# Patient Record
Sex: Female | Born: 1965
Health system: Southern US, Community
[De-identification: ages and names within clinical notes are randomized; demographics above are authoritative.]

## PROBLEM LIST (undated history)

## (undated) DIAGNOSIS — R5383 Other fatigue: Secondary | ICD-10-CM

## (undated) DIAGNOSIS — B977 Papillomavirus as the cause of diseases classified elsewhere: Secondary | ICD-10-CM

## (undated) DIAGNOSIS — D649 Anemia, unspecified: Secondary | ICD-10-CM

## (undated) DIAGNOSIS — B009 Herpesviral infection, unspecified: Secondary | ICD-10-CM

## (undated) DIAGNOSIS — D219 Benign neoplasm of connective and other soft tissue, unspecified: Secondary | ICD-10-CM

## (undated) DIAGNOSIS — F32A Depression, unspecified: Secondary | ICD-10-CM

## (undated) DIAGNOSIS — F329 Major depressive disorder, single episode, unspecified: Secondary | ICD-10-CM

## (undated) HISTORY — DX: Anemia, unspecified: D64.9

## (undated) HISTORY — PX: CERVIX LESION DESTRUCTION: SHX591

## (undated) HISTORY — DX: Papillomavirus as the cause of diseases classified elsewhere: B97.7

## (undated) HISTORY — PX: TONSILLECTOMY AND ADENOIDECTOMY: SHX28

## (undated) HISTORY — DX: Major depressive disorder, single episode, unspecified: F32.9

## (undated) HISTORY — PX: CYSTECTOMY: SUR359

## (undated) HISTORY — DX: Benign neoplasm of connective and other soft tissue, unspecified: D21.9

## (undated) HISTORY — DX: Depression, unspecified: F32.A

## (undated) HISTORY — DX: Other fatigue: R53.83

## (undated) HISTORY — DX: Herpesviral infection, unspecified: B00.9

---

## 1986-03-23 HISTORY — PX: TMJ ARTHROPLASTY: SHX1066

## 1989-03-23 HISTORY — PX: COLONOSCOPY: SHX5424

## 1998-07-10 ENCOUNTER — Other Ambulatory Visit: Admission: RE | Admit: 1998-07-10 | Discharge: 1998-07-10 | Payer: Self-pay | Admitting: Gynecology

## 2010-01-17 ENCOUNTER — Encounter: Admission: RE | Admit: 2010-01-17 | Discharge: 2010-01-17 | Payer: Self-pay | Admitting: Family Medicine

## 2010-10-16 ENCOUNTER — Other Ambulatory Visit: Payer: Self-pay | Admitting: Emergency Medicine

## 2010-10-16 DIAGNOSIS — N6310 Unspecified lump in the right breast, unspecified quadrant: Secondary | ICD-10-CM

## 2010-10-17 ENCOUNTER — Encounter (INDEPENDENT_AMBULATORY_CARE_PROVIDER_SITE_OTHER): Payer: Self-pay | Admitting: Surgery

## 2010-10-23 ENCOUNTER — Inpatient Hospital Stay: Admission: RE | Admit: 2010-10-23 | Payer: Self-pay | Source: Ambulatory Visit

## 2010-10-23 ENCOUNTER — Other Ambulatory Visit: Payer: Self-pay

## 2010-10-28 ENCOUNTER — Encounter (INDEPENDENT_AMBULATORY_CARE_PROVIDER_SITE_OTHER): Payer: Self-pay | Admitting: Surgery

## 2010-10-28 ENCOUNTER — Ambulatory Visit (INDEPENDENT_AMBULATORY_CARE_PROVIDER_SITE_OTHER): Payer: BC Managed Care – PPO | Admitting: Surgery

## 2010-10-28 DIAGNOSIS — N6310 Unspecified lump in the right breast, unspecified quadrant: Secondary | ICD-10-CM

## 2010-10-28 DIAGNOSIS — N63 Unspecified lump in unspecified breast: Secondary | ICD-10-CM

## 2010-10-28 NOTE — Progress Notes (Addendum)
Rhonda Cruz is a 45 y.o. female.    Chief Complaint  Patient presents with  . Breast Mass    HPI HPI 45 yo female had a recent clinical breast exam by Dr. Cleta Alberts, who noted a 2 cm mass in the right upper outer quadrant.   The patient had a mammogram at [facility] in June of this year which was reportedly normal. We are awaiting that report. She has no complaints from her breast. No nipple discharge no retraction. No skin changes. No breast tenderness. She does have a family history of breast cancer in her mother at age 59. Her mother underwent a lumpectomy and what sounds like a sentinel lymph node biopsy. She does not think that her mother had chemotherapy. Past Medical History  Diagnosis Date  . Asthma   . Fatigue     Past Surgical History  Procedure Date  . Cystectomy     knee  . Tmj arthroplasty   . Tonsillectomy and adenoidectomy     Family History  Problem Relation Age of Onset  . Cancer Mother     breast  . Hypertension Father   . Cancer Father     skin  . Cancer Brother     signs of skin cancer     Social History History  Substance Use Topics  . Smoking status: Never Smoker   . Smokeless tobacco: Never Used  . Alcohol Use: 0.0 oz/week    3-1 Glasses of wine per week    No Known Allergies  Current Outpatient Prescriptions  Medication Sig Dispense Refill  . fish oil-omega-3 fatty acids 1000 MG capsule Take 2 g by mouth daily.        . Fluticasone-Salmeterol (ADVAIR DISKUS) 250-50 MCG/DOSE AEPB Inhale 1 puff into the lungs every 12 (twelve) hours.        . Vitamins-Lipotropics (VIT BALANCED B-100 PO) Take by mouth.          Review of Systems ROS Reviewed with the patient. Positive for chronic fatigue. Menarche age 80 only pregnancy at age 62 which resulted in a miscarriage.  Physical Exam Physical Exam   Blood pressure 118/76, pulse 60, temperature 97.6 F (36.4 C). WDWN in NAD.   Breasts - symmetric; no nipple retraction; no nipple discharge No  axillary lymphadenopathy Bilateral fibrocystic changes Right upper outer quadrant at 10:30 - 1.5 cm palpable mass - firm, mobile  Assessment/Plan Right upper outer quadrant breast mass Mammogram showed extremely dense breast tissue - decreasing the sensitivity of the mammogram.  Recommend right breast ultrasound - patient is scheduled for tomorrow at [facility].   Recheck in 1 week to discuss findings. Ceniya Fowers K. 10/28/2010, 3:59 PM   Addendum:  The mammogram and ultrasound this morning showed no suspicious findings in the area in question.  It is likely that this represents fibrocystic changes with a single area showing a slightly larger palpable mass.  We will recheck the patient in 3-6 months for another breast exam, but no biopsies are indicated at this time.

## 2010-10-29 ENCOUNTER — Encounter (INDEPENDENT_AMBULATORY_CARE_PROVIDER_SITE_OTHER): Payer: Self-pay | Admitting: Surgery

## 2010-10-29 ENCOUNTER — Ambulatory Visit
Admission: RE | Admit: 2010-10-29 | Discharge: 2010-10-29 | Disposition: A | Discharge status: Home / Self Care | Payer: BC Managed Care – PPO | Source: Ambulatory Visit | Attending: Emergency Medicine | Admitting: Emergency Medicine

## 2010-10-29 ENCOUNTER — Ambulatory Visit
Admission: RE | Admit: 2010-10-29 | Discharge: 2010-10-29 | Disposition: A | Discharge status: Home / Self Care | Payer: Self-pay | Source: Ambulatory Visit | Attending: Emergency Medicine | Admitting: Emergency Medicine

## 2010-10-29 DIAGNOSIS — N6310 Unspecified lump in the right breast, unspecified quadrant: Secondary | ICD-10-CM

## 2010-11-03 ENCOUNTER — Encounter (INDEPENDENT_AMBULATORY_CARE_PROVIDER_SITE_OTHER): Payer: BC Managed Care – PPO | Admitting: Surgery

## 2011-07-25 ENCOUNTER — Ambulatory Visit (INDEPENDENT_AMBULATORY_CARE_PROVIDER_SITE_OTHER): Payer: BC Managed Care – PPO | Admitting: Emergency Medicine

## 2011-07-25 VITALS — BP 113/71 | HR 68 | Temp 97.8°F | Resp 16 | Ht 62.0 in | Wt 131.6 lb

## 2011-07-25 DIAGNOSIS — N63 Unspecified lump in unspecified breast: Secondary | ICD-10-CM

## 2011-07-25 DIAGNOSIS — H109 Unspecified conjunctivitis: Secondary | ICD-10-CM

## 2011-07-25 DIAGNOSIS — N6310 Unspecified lump in the right breast, unspecified quadrant: Secondary | ICD-10-CM

## 2011-07-25 DIAGNOSIS — J45909 Unspecified asthma, uncomplicated: Secondary | ICD-10-CM

## 2011-07-25 DIAGNOSIS — J029 Acute pharyngitis, unspecified: Secondary | ICD-10-CM

## 2011-07-25 MED ORDER — OFLOXACIN 0.3 % OP SOLN: 2.0000 [drp] | OPHTHALMIC | Status: AC

## 2011-07-25 MED ORDER — FLUTICASONE-SALMETEROL 100-50 MCG/DOSE IN AEPB: 1.0000 | INHALATION_SPRAY | Freq: Two times a day (BID) | RESPIRATORY_TRACT | Status: DC

## 2011-07-25 MED ORDER — ALBUTEROL SULFATE HFA 108 (90 BASE) MCG/ACT IN AERS: 2.0000 | INHALATION_SPRAY | Freq: Four times a day (QID) | RESPIRATORY_TRACT | Status: DC | PRN

## 2011-07-25 NOTE — Progress Notes (Signed)
  Subjective:    Patient ID: Rhonda Cruz, female    DOB: Jan 30, 1966, 46 y.o.   MRN: [id number]  HPI for refill of her asthma medications. She also is been bothered with a drainage from both eyes. She's had a mild sore throat but not severe. She has been using Neosporin ointment to her eyes to    Review of Systems when she was here in August she was found to have a mass in her right upper quadrant of the right breast. She subsequently had a mammogram and ultrasound performed which did not disclose a mass. She saw Dr.Tsuei and from his note was due to have a followup appointment in 6 months. She states she did not know about this appointment. As noted the mass like area between her breasts primarily on the left Close to the sternum .    Objective:   Physical Exam HEENT exam exam reveals moderate redness of the conjunctiva of both eyes. There are no preauricular nodes the throat is slightly red. Chest is clear. There is a 1 x 1.5 cm smooth rubbery area over the sternum. Examination of the right breast reveals dense tissue present about 2 cm from the nipple on the right in the upper outer quadrant.    Results for orders placed in visit on 07/25/11  POCT RAPID STREP A (OFFICE)      Component Value Range   Rapid Strep A Screen Negative  Negative       Assessment & Plan:   I suspect most of her symptoms are allergy related. I will cover her with Ocuflox for both eyes. I have made referral to Dr. Corliss Skains. She had questions regarding travel to [place]. I've asked her to check with the health Department regarding immunizations and those things they are not able to do we will be happy to evaluate in the office period. I am not sure why she did not follow up with Dr. Corliss Skains as instructed. She states she does not know why.

## 2011-07-28 ENCOUNTER — Encounter (INDEPENDENT_AMBULATORY_CARE_PROVIDER_SITE_OTHER): Payer: Self-pay | Admitting: Surgery

## 2011-07-30 ENCOUNTER — Telehealth: Payer: Self-pay

## 2011-07-30 MED ORDER — FLUTICASONE-SALMETEROL 250-50 MCG/DOSE IN AEPB: 1.0000 | INHALATION_SPRAY | Freq: Two times a day (BID) | RESPIRATORY_TRACT | Status: DC

## 2011-07-30 NOTE — Telephone Encounter (Signed)
I sent an advair 250/50 to the pharmacy.  The eye drops are 2 drops every 4 hours while awake

## 2011-07-30 NOTE — Telephone Encounter (Signed)
Pt notified. Pt was very upset that we called in the wrong Advair. Pt was asking if she could exchange the Advair's and I told her I was not sure that she would have to speak with the pharmacy. Pt was also notified on the dosage of the eye drops. Pt states she is feeling better and wanted to know if she needed them since she hasn't used them yet. Per [name] if pt had not started using the drops and she is feeling better that she doesn't not have to use them. Pt then preceded to get very upset on the phone saying that she was going to contact her lawyer if she was unable to exchange the Advair because it was "our fault" and we should have to reimburse her for the Advair that was called in wrong and she's mad that she picked up these expensive eye drops only to not need them. Apologized a lot and told her per [name] that we do make mistakes no matter how hard we try not to.

## 2011-07-30 NOTE — Telephone Encounter (Signed)
Pt called, was seen over the weekend and received rx that Dr. Cleta Alberts prescribed.  Pt noticed dosage on Advair was incorrect, and there were no clear instructions for the eye drops.

## 2011-07-31 ENCOUNTER — Telehealth: Payer: Self-pay

## 2011-07-31 NOTE — Telephone Encounter (Signed)
Returned patients call. Upset with the problems she is having concerning wrong medication and repeated calls. Please help get worked out.  After discussion with Porfirio Oar, PA-C the plan was as follows: to contact the [employer] representative to seek assistance for this patient due to the lack of funds. [employer] donated a 1 month supply of medication and 2 coupons for further refills.  ZOX#[id number] Exp 08/2012

## 2011-07-31 NOTE — Telephone Encounter (Signed)
Good solution to difficult situation.  Thanks to all who helped get the patient what she needed.

## 2011-07-31 NOTE — Telephone Encounter (Signed)
Please refer to 07/30/2011 message

## 2011-07-31 NOTE — Telephone Encounter (Signed)
PT PICKED UP AND PAID FOR RX. GOT HOME AND REALIZED IT WAS THE WRONG ONE. PHARMACY CAN'T FIX IT WITHOUT [place]    AND ....   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

## 2011-08-14 ENCOUNTER — Encounter (INDEPENDENT_AMBULATORY_CARE_PROVIDER_SITE_OTHER): Payer: BC Managed Care – PPO | Admitting: Surgery

## 2011-08-15 NOTE — Progress Notes (Signed)
This encounter was created in error - please disregard.

## 2011-08-25 ENCOUNTER — Other Ambulatory Visit: Payer: Self-pay | Admitting: Emergency Medicine

## 2011-08-25 DIAGNOSIS — Z1231 Encounter for screening mammogram for malignant neoplasm of breast: Secondary | ICD-10-CM

## 2011-08-29 ENCOUNTER — Ambulatory Visit (INDEPENDENT_AMBULATORY_CARE_PROVIDER_SITE_OTHER): Payer: BC Managed Care – PPO | Admitting: Family Medicine

## 2011-08-29 VITALS — BP 123/81 | HR 64 | Temp 98.1°F | Resp 16 | Ht 61.5 in | Wt 132.0 lb

## 2011-08-29 DIAGNOSIS — Z23 Encounter for immunization: Secondary | ICD-10-CM

## 2011-08-29 DIAGNOSIS — H109 Unspecified conjunctivitis: Secondary | ICD-10-CM

## 2011-08-29 DIAGNOSIS — M25559 Pain in unspecified hip: Secondary | ICD-10-CM

## 2011-08-29 DIAGNOSIS — Z789 Other specified health status: Secondary | ICD-10-CM

## 2011-08-29 MED ORDER — HEPATITIS A VACCINE 1440 EL U/ML IM SUSP: 1.0000 mL | Freq: Once | INTRAMUSCULAR | Status: DC

## 2011-08-29 MED ORDER — TYPHOID VACCINE PO CPDR: 1.0000 | DELAYED_RELEASE_CAPSULE | ORAL | Status: AC

## 2011-08-29 MED ORDER — CIPROFLOXACIN HCL 500 MG PO TABS: ORAL_TABLET | ORAL | Status: DC

## 2011-08-29 MED ORDER — TETANUS-DIPHTH-ACELL PERTUSSIS 5-2.5-18.5 LF-MCG/0.5 IM SUSP: 0.5000 mL | Freq: Once | INTRAMUSCULAR | Status: DC

## 2011-08-29 MED ORDER — MEASLES, MUMPS & RUBELLA VAC ~~LOC~~ INJ: 0.5000 mL | INJECTION | Freq: Once | SUBCUTANEOUS | Status: DC

## 2011-08-29 MED ORDER — ATOVAQUONE-PROGUANIL HCL 250-100 MG PO TABS: 1.0000 | ORAL_TABLET | Freq: Every day | ORAL | Status: DC

## 2011-08-29 MED ORDER — ONDANSETRON 4 MG PO TBDP: ORAL_TABLET | ORAL | Status: DC

## 2011-08-29 NOTE — Progress Notes (Signed)
Subjective: Patient is here to review immunizations for travel to [place]. She will go there for 2 weeks. We had a long discussion regarding her immunizations and prophylaxis while there'  Patient has left hip pain. Is known injury. It seems to be tender deep in the hip. Hurts if she crosses her leg.  Has had some recurrences of that red eye doctor treated her for  Objective: At is only minimally red today. She used antibiotic drops yesterday. Fundi are normal.  Her hip is only minimally tender. Good range of motion. Gait normal.  Assessment: Travel medicine Hip pain and strain Recurrent conjunctivitis  Plan: Use the eyedrops for several days consecutively Aleve 2 twice daily Immunizations including MMR, Hep a, TDAP, malaria, cipro, zofran Typhoid oral

## 2011-08-29 NOTE — Patient Instructions (Addendum)
Hepatitis a requires a second vaccine in 6-12 months for full protection  Take the typhoid oral vaccine carefully according to instructions for effectiveness.  Start the Malarone a couple of days before departure. That will allow you to see if you tolerate it before you're traveling. Continue the Malarone until one week after you return.  In the event that you cannot tolerate it for some reason on your trip, go to a pharmacy and get doxycycline and take 100 mg once daily  Have medicine along for pain and fever, such as Tylenol or ibuprofen  Zofran 4 mg dissolved in mouth every 4-6 hours as needed for vomiting  Cipro 500 mg twice daily at onset of traveler's diarrhea to continue for 3-5 days

## 2011-09-14 ENCOUNTER — Encounter: Payer: Self-pay | Admitting: Obstetrics and Gynecology

## 2011-09-14 ENCOUNTER — Ambulatory Visit (INDEPENDENT_AMBULATORY_CARE_PROVIDER_SITE_OTHER): Payer: BC Managed Care – PPO | Admitting: Obstetrics and Gynecology

## 2011-09-14 VITALS — BP 112/68 | Temp 98.2°F | Ht 61.0 in | Wt 135.0 lb

## 2011-09-14 DIAGNOSIS — N912 Amenorrhea, unspecified: Secondary | ICD-10-CM

## 2011-09-14 DIAGNOSIS — N949 Unspecified condition associated with female genital organs and menstrual cycle: Secondary | ICD-10-CM

## 2011-09-14 DIAGNOSIS — Z01419 Encounter for gynecological examination (general) (routine) without abnormal findings: Secondary | ICD-10-CM

## 2011-09-14 DIAGNOSIS — Z124 Encounter for screening for malignant neoplasm of cervix: Secondary | ICD-10-CM

## 2011-09-14 DIAGNOSIS — R102 Pelvic and perineal pain: Secondary | ICD-10-CM

## 2011-09-14 LAB — POCT URINALYSIS DIPSTICK
Leukocytes, UA: NEGATIVE
Protein, UA: NEGATIVE
pH, UA: 5

## 2011-09-14 MED ORDER — TRANEXAMIC ACID 650 MG PO TABS: 1300.0000 mg | ORAL_TABLET | Freq: Three times a day (TID) | ORAL | Status: DC

## 2011-09-14 MED ORDER — TRANEXAMIC ACID 100 MG/ML IV SOLN: 1.5000 mg/kg/h | INTRAVENOUS | Status: DC

## 2011-09-14 NOTE — Progress Notes (Signed)
Subjective:    Rhonda Cruz is a 46 y.o. female, G1P0, who presents for an annual exam. The patient reports frequent urination for past several weeks.  Denies dysuria, fever, or hematuria. Going to [place] x 3 weeks  Hasn't had a period since April with transient hot flashes that have now abated.     Menstrual cycle:   LMP: Patient's last menstrual period was 07/15/2011.             Review of Systems Pertinent items are noted in HPI. Denies pelvic pain, urinary tract symptoms, vaginitis symptoms, irregular bleeding, menopausal symptoms, change in bowel habits or rectal bleeding   Objective:    BP 112/68  Temp 98.2 F (36.8 C) (Oral)  Ht 5\' 1"  (1.549 m)  Wt 135 lb (61.236 kg)  BMI 25.51 kg/m2  LMP 07/15/2011   @Weigh @t :  Wt Readings from Last 1 Encounters:  09/14/11 135 lb (61.236 kg)   Body mass index is 25.51 kg/(m^2). General Appearance: Alert, no acute distress HEENT: Grossly normal Neck / Thyroid: Supple, no thyromegaly or cervical adenopathy Lungs: Clear to auscultation bilaterally Back: No CVA tenderness Breast Exam: No masses or nodes.No dimpling, nipple retraction or discharge. Cardiovascular: Regular rate and rhythm.  Gastrointestinal: Soft, non-tender, no masses or organomegaly Pelvic Exam: EGBUS-wnl, vagina-normal rugae, cervix- without lesions or tenderness, uterus appears normal size shape and consistency, adnexae-no masses or tenderness Rectovaginal: no masses and normal sphincter tone Lymphatic Exam: Non-palpable nodes in neck, clavicular,  axillary, or inguinal regions  Skin: no rashes or abnormalities Extremities: no clubbing cyanosis or edema  Neurologic: grossly normal Psychiatric: Alert and oriented  Urinalysis:negative UPT: negative  Assessment:   Routine GYN Exam Amenorrhea * Left sided pelvic pain Plan:  Requesting medication in case she begins to bleed heavily while on her trip (since she's skipped her menses)   PAP  sent  Lysteda 650 mg # 30 2 po tid x 5 days prn (if menses RTO 1 year or prn  * Prior to discharge patient states it was uncomforable when she was examined on the left and that it is uncomfortable there with intercourse.   Pelvic U/S scheduled  Tyhesha Dutson,ELMIRAPA-C

## 2011-09-14 NOTE — Patient Instructions (Signed)
Probiotics:  [place] or [employer] for a  $30 coupon

## 2011-09-14 NOTE — Progress Notes (Signed)
Regular Periods: no Mammogram: yes 2012  Monthly Breast Ex.: no Exercise: yes  Tetanus < 10 years: yes Seatbelts: yes  NI. Bladder Functn.: yes Abuse at home: no  Daily BM's: yes Stressful Work: yes  Healthy Diet: yes Sigmoid-Colonoscopy: within 8 years  Calcium: no Medical problems this year: none   LAST PAP:04/03/2010  Contraception: none  Mammogram:  2012  PCP: none

## 2011-09-16 ENCOUNTER — Telehealth: Payer: Self-pay | Admitting: Obstetrics and Gynecology

## 2011-09-16 LAB — PAP IG W/ RFLX HPV ASCU

## 2011-09-16 NOTE — Telephone Encounter (Signed)
Pt called to request earlier appointment for pelvic ultrasound. Pt states she is going out of the country for 3 weeks and would like to resolve any issues that may be found on the ultrasound before she leaves. Advised pt that she will have appointment with EP on the same day as the ultrasound and that plan of care will be discussed at that time. Unable to offer patient any earlier appointments because there is nothing available for both ultrasound and EP before her scheduled appointment. Pt voiced understanding.

## 2011-09-16 NOTE — Telephone Encounter (Signed)
Triage/epic 

## 2011-09-17 ENCOUNTER — Ambulatory Visit
Admission: RE | Admit: 2011-09-17 | Discharge: 2011-09-17 | Disposition: A | Discharge status: Home / Self Care | Payer: BC Managed Care – PPO | Source: Ambulatory Visit | Attending: Emergency Medicine | Admitting: Emergency Medicine

## 2011-09-17 DIAGNOSIS — Z1231 Encounter for screening mammogram for malignant neoplasm of breast: Secondary | ICD-10-CM

## 2011-09-22 ENCOUNTER — Telehealth: Payer: Self-pay

## 2011-09-22 NOTE — Telephone Encounter (Signed)
Pt was in office on 08/29/11 and had immunizations for trip out of country.  States she needs documentation for trip. Best # to call [phone].

## 2011-09-22 NOTE — Telephone Encounter (Signed)
LMOM to pick up immunization print out.in the pick up box at check in.

## 2011-10-02 ENCOUNTER — Other Ambulatory Visit: Payer: BC Managed Care – PPO

## 2011-10-02 ENCOUNTER — Encounter: Payer: BC Managed Care – PPO | Admitting: Obstetrics and Gynecology

## 2011-10-03 ENCOUNTER — Ambulatory Visit (INDEPENDENT_AMBULATORY_CARE_PROVIDER_SITE_OTHER): Payer: BC Managed Care – PPO | Admitting: Family Medicine

## 2011-10-03 VITALS — BP 100/66 | HR 64 | Temp 98.5°F | Resp 16 | Ht 62.25 in | Wt 132.6 lb

## 2011-10-03 DIAGNOSIS — Z7189 Other specified counseling: Secondary | ICD-10-CM

## 2011-10-03 DIAGNOSIS — IMO0002 Reserved for concepts with insufficient information to code with codable children: Secondary | ICD-10-CM

## 2011-10-03 DIAGNOSIS — L259 Unspecified contact dermatitis, unspecified cause: Secondary | ICD-10-CM

## 2011-10-03 MED ORDER — TRIAMCINOLONE ACETONIDE 0.1 % EX CREA: TOPICAL_CREAM | Freq: Two times a day (BID) | CUTANEOUS | Status: AC

## 2011-10-03 NOTE — Progress Notes (Signed)
subjective: 46 year old lady with a rash for the last few days her right forearm around the elbow area and some on the right lower abdomen. She does not know of getting into any poison ivy but a CAT was in it. She says she's not been allergic to in the past. The rash is primarily itchy, not painful.  She wanted to discuss the record of her vaccinations. She's finished her typhoid. She is going to a non yellow fever area in [place]. Flies from [place] to [place]. It town. She had one trip [place] which is a malaria area. She will start her malaria medicine before then. She wanted to know if she needed a yellow card. I told her that use a cane with giving the yellow fever vaccine. Even though other shots are listed in it, I do not have any yellow fever cards.  Objective: Linear excoriations right elbow and right abdominal wall consistent with contact dermatitis  Assessment: Contact dermatitis Immunization review  Plan: Cognitive immunizations Triamcinolone cream [facility].

## 2011-10-05 ENCOUNTER — Ambulatory Visit (INDEPENDENT_AMBULATORY_CARE_PROVIDER_SITE_OTHER): Payer: BC Managed Care – PPO

## 2011-10-05 ENCOUNTER — Encounter: Payer: Self-pay | Admitting: Obstetrics and Gynecology

## 2011-10-05 ENCOUNTER — Ambulatory Visit (INDEPENDENT_AMBULATORY_CARE_PROVIDER_SITE_OTHER): Payer: BC Managed Care – PPO | Admitting: Obstetrics and Gynecology

## 2011-10-05 VITALS — BP 100/60 | HR 60 | Wt 134.0 lb

## 2011-10-05 DIAGNOSIS — N949 Unspecified condition associated with female genital organs and menstrual cycle: Secondary | ICD-10-CM

## 2011-10-05 DIAGNOSIS — D259 Leiomyoma of uterus, unspecified: Secondary | ICD-10-CM

## 2011-10-05 DIAGNOSIS — D219 Benign neoplasm of connective and other soft tissue, unspecified: Secondary | ICD-10-CM

## 2011-10-05 DIAGNOSIS — N9489 Other specified conditions associated with female genital organs and menstrual cycle: Secondary | ICD-10-CM

## 2011-10-05 DIAGNOSIS — R102 Pelvic and perineal pain: Secondary | ICD-10-CM

## 2011-10-05 NOTE — Progress Notes (Signed)
45 YO seen 09/14/11 for menorrhagia returns for ultrasound.  O: U/S- uterus-7.78 x 9.37 x 4.66 cm with #2 fibroids: posterior/left 7.3 x  5.9 x 6.8 cm and anterior fundal 6.1 x 6.9 x 6.3 cm and a submucosal vs polyp 1cm x 1cm; ovaries appear to be within normal limits   A: Menorrhagia     Uterine Fibroids with submucosal component vs polyp  P:  Reviewed medical & surgical management options for fibroids and menorrhagia       Patient to use Lysteda for now as she's going on a mission trip x 3 weeks       Will consider other management upon return       Patient may also consider a Diva Cup to prevent overflow of menses       RTO-Aex or prn

## 2011-10-05 NOTE — Patient Instructions (Signed)
Fibroids Fibroids are lumps (tumors) that can occur any place in a woman's body. These lumps are not cancerous. Fibroids vary in size, weight, and where they grow. HOME CARE  Do not take aspirin.   Write down the number of pads or tampons you use during your period. Tell your doctor. This can help determine the best treatment for you.  GET HELP RIGHT AWAY IF:  You have pain in your lower belly (abdomen) that is not helped with medicine.   You have cramps that are not helped with medicine.   You have more bleeding between or during your period.   You feel lightheaded or pass out (faint).   Your lower belly pain gets worse.  MAKE SURE YOU:  Understand these instructions.   Will watch your condition.   Will get help right away if you are not doing well or get worse.  Document Released: 04/11/2010 Document Revised: 02/26/2011 Document Reviewed: 04/11/2010 Exit[employer] Patient Information 2012 [employer]. 

## 2012-02-27 ENCOUNTER — Other Ambulatory Visit: Payer: Self-pay | Admitting: Emergency Medicine

## 2012-02-27 ENCOUNTER — Telehealth: Payer: Self-pay

## 2012-02-27 NOTE — Telephone Encounter (Signed)
Patient called asking why her eye drop medication was denied today. Patient says she called earlier but there was no documentation. She just wants to know if she needs to be seen again or not? Best number is cell phone.

## 2012-02-28 NOTE — Telephone Encounter (Signed)
Yes, she was seen in May for conjunctivitis. A refill is not appropriate. If she feels like she is developing symptoms again I would advise her to come in to be evaluated.

## 2012-02-28 NOTE — Telephone Encounter (Signed)
Called pt at [phone], [facility] to [facility]

## 2012-03-06 NOTE — Telephone Encounter (Signed)
Left message for pt to call back  °

## 2012-03-07 NOTE — Telephone Encounter (Signed)
Left message on machine to return back to clinic to be evaluated if she feels her symptoms are retuning.

## 2012-03-22 ENCOUNTER — Telehealth: Payer: Self-pay | Admitting: Obstetrics and Gynecology

## 2012-03-22 NOTE — Telephone Encounter (Signed)
Pt states she has not had a cycle since 01/01/12 and wants to if she needs to be seen.

## 2012-03-22 NOTE — Telephone Encounter (Signed)
Tc from pt stating that she has not had a cycle since 01/01/12 and wanted to know what she need to do. Pt states that she is going back to school and she do not want her cycle to come on and be really heavy so she want evaluation. Schedule pt an appt with EP on1/2/13. Pt voiced understanding.

## 2012-03-24 ENCOUNTER — Telehealth: Payer: Self-pay

## 2012-03-24 ENCOUNTER — Encounter: Payer: BC Managed Care – PPO | Admitting: Obstetrics and Gynecology

## 2012-03-24 NOTE — Telephone Encounter (Signed)
TELEPHONE CALL DONE

## 2012-07-05 ENCOUNTER — Telehealth: Payer: Self-pay

## 2012-07-05 NOTE — Telephone Encounter (Signed)
PATIENT STATES SHE WAS GOING TO [place] AND HAD A IMMUNIZATION LAST SUMMER.  FROM HER UNDERSTANDING, IT WAS SUPPOSE TO BE A 2 PART IMMUNIZATION. SHE HAS NEVER GOTTEN THE 2ND ONE. SHE WOULD LIKE TO KNOW WHAT SHE WAS GIVEN AND WHEN SHE IS TO RETURN? BEST PHONE [phone] (CELL)  [facility]

## 2012-07-06 NOTE — Telephone Encounter (Signed)
She had 1st Hep A, can come in for 2nd at any time.

## 2012-07-06 NOTE — Telephone Encounter (Signed)
Spoke with the patient and informed of what vaccination she received. Also, to come in for 2nd Hep A to finish the series. She understood and will return to the clinic for second Hep A.

## 2012-08-06 ENCOUNTER — Ambulatory Visit (INDEPENDENT_AMBULATORY_CARE_PROVIDER_SITE_OTHER): Payer: BC Managed Care – PPO | Admitting: Physician Assistant

## 2012-08-06 VITALS — BP 112/70 | HR 66 | Temp 97.8°F | Resp 16 | Ht 62.5 in | Wt 135.0 lb

## 2012-08-06 DIAGNOSIS — Z23 Encounter for immunization: Secondary | ICD-10-CM

## 2012-08-06 DIAGNOSIS — Z130 Encounter for screening for diseases of the blood and blood-forming organs and certain disorders involving the immune mechanism: Secondary | ICD-10-CM

## 2012-08-06 DIAGNOSIS — Z9229 Personal history of other drug therapy: Secondary | ICD-10-CM

## 2012-08-06 DIAGNOSIS — Z13228 Encounter for screening for other metabolic disorders: Secondary | ICD-10-CM

## 2012-08-06 NOTE — Progress Notes (Signed)
Patient ID: Rhonda Cruz MRN: [id number], DOB: 07-01-1965, 47 y.o. Date of Encounter: 08/06/2012, 9:59 AM  Primary Physician: Dois Davenport., MD  Chief Complaint: Immunization review  HPI: 47 y.o. female with history below presents for immunization review. 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 She will be living off campus. She has a copy of her immunizations with her. Needs PPD, 3rd hepatitis B, and 2nd hepatitis A vaccines. Generally healthy. She is otherwise doing well and has no other issues to discuss today.    Past Medical History  Diagnosis Date  . Asthma   . Fatigue   . Anemia   . Depression   . HSV infection   . HPV (human papilloma virus) infection      Home Meds: Prior to Admission medications   Medication Sig Start Date End Date Taking? Authorizing Provider  fish oil-omega-3 fatty acids 1000 MG capsule Take 2 g by mouth daily.     Yes Historical Provider, MD  Fluticasone-Salmeterol (ADVAIR DISKUS) 250-50 MCG/DOSE AEPB Inhale 1 puff into the lungs every 12 (twelve) hours. 07/30/11  Yes Marzella Schlein McClung, PA-C  ibuprofen (ADVIL,MOTRIN) 200 MG tablet Take 400 mg by mouth every 6 (six) hours as needed for pain.   Yes Historical Provider, MD  Maca Root 500 MG CAPS Take 1 capsule by mouth 2 (two) times daily.   Yes Historical Provider, MD  predniSONE (STERAPRED UNI-PAK) 10 MG tablet Take 10 mg by mouth daily. Pt taking for root canal- last dose is Sunday 08/07/12   Yes Historical Provider, MD  Vitamins-Lipotropics (VIT BALANCED B-100 PO) Take by mouth.    Yes Historical Provider, MD  albuterol (PROVENTIL HFA;VENTOLIN HFA) 108 (90 BASE) MCG/ACT inhaler Inhale 2 puffs into the lungs every 6 (six) hours as needed for wheezing. 07/25/11 07/24/12  Collene Gobble, MD  atovaquone-proguanil (MALARONE) 250-100 MG TABS Take 1 tablet by mouth daily. 08/29/11   Peyton Najjar, MD  ciprofloxacin (CIPRO) 500 MG tablet Take one twice daily beginning onset  of traveler's diarrhea, and continue for 3-5 days. 08/29/11   Peyton Najjar, MD  ondansetron (ZOFRAN ODT) 4 MG disintegrating tablet Dissolve one pill in mouth every 4-6 hours as needed for nausea and vomiting 08/29/11   Peyton Najjar, MD  tranexamic acid (LYSTEDA) 650 MG TABS Take 2 tablets (1,300 mg total) by mouth 3 (three) times daily. 09/14/11   Henreitta Leber, PA-C  triamcinolone cream (KENALOG) 0.1 % Apply topically 2 (two) times daily. 10/03/11 10/02/12  Peyton Najjar, MD    Allergies: No Known Allergies  History   Social History  . Marital Status: Single    Spouse Name: N/A    Number of Children: N/A  . Years of Education: N/A   Occupational History  . Not on file.   Social History Main Topics  . Smoking status: Never Smoker   . Smokeless tobacco: Never Used  . Alcohol Use: 0.0 oz/week    3-1 Glasses of wine per week     Comment: 1-3 drinks/ 2-3 days a week  . Drug Use: No  . Sexually Active: No   Other Topics Concern  . Not on file   Social History Narrative  . No narrative on file     Review of Systems: Constitutional: positive for night sweats. negative for chills, fever, weight changes, or fatigue  HEENT: negative for vision changes or hearing loss Cardiovascular: negative for chest pain or palpitations Respiratory:  negative for wheezing, shortness of breath, or cough Dermatological: negative for rash   Physical Exam: Blood pressure 112/70, pulse 66, temperature 97.8 F (36.6 C), temperature source Oral, resp. rate 16, height 5' 2.5" (1.588 m), weight 135 lb (61.236 kg), last menstrual period 03/23/2012, SpO2 100.00%., Body mass index is 24.28 kg/(m^2). General: Well developed, well nourished, in no acute distress. Head: Normocephalic, atraumatic, eyes without discharge, sclera non-icteric, nares are without discharge.   Neck: Supple. Full ROM.  Lungs: Clear bilaterally to auscultation without wheezes, rales, or rhonchi. Breathing is unlabored. Heart: RRR  with S1 S2. No murmurs, rubs, or gallops appreciated. Msk:  Strength and tone normal for age. Extremities/Skin: Warm and dry. No clubbing or cyanosis. No edema. No rashes or suspicious lesions. Neuro: Alert and oriented X 3. Moves all extremities spontaneously. Gait is normal. CNII-XII grossly in tact. Psych:  Responds to questions appropriately with a normal affect.     ASSESSMENT AND PLAN:  47 y.o. female here for immunization review for college -PPD placed, RTC 48-72 hours for reading -3rd hepatitis B vaccine received -2nd hepatitis A vaccine received -Form completed -RTC prn   Signed, Eula Listen, PA-C 08/06/2012 9:59 AM

## 2012-08-06 NOTE — Progress Notes (Signed)
  Subjective:    Patient ID: Rhonda Cruz, female    DOB: 07-03-1965, 47 y.o.   MRN: [id number]  HPI    Review of Systems     Tuberculosis Risk Questionnaire  1. Were you born outside the [place] in one of the following parts of the world: [place], [place], [place], [place] or [place]?  No  2. Have you traveled outside the [place] and lived for more than one month in one of the following parts of the world: [place], [place], [place], [place] or [place]?  No  3. Do you have a compromised immune system such as from any of the following conditions:HIV/AIDS, organ or bone marrow transplantation, diabetes, immunosuppressive medicines (e.g. Prednisone, Remicaide), leukemia, lymphoma, cancer of the head or neck, gastrectomy or jejunal bypass, end-stage renal disease (on dialysis), or silicosis?  No   4. Have you ever or do you plan on working in: a residential care center, a health care facility, a jail or prison or homeless shelter?  No  5. Have you ever: injected illegal drugs, used crack cocaine, lived in a homeless shelter  or been in jail or prison?   No  6. Have you ever been exposed to anyone with infectious tuberculosis?  No   Tuberculosis Symptom Questionnaire  Do you currently have any of the following symptoms?  1. Unexplained cough lasting more than 3 weeks? No  Unexplained fever lasting more than 3 weeks. No   3. Night Sweats (sweating that leaves the bedclothes and sheets wet)   Yes - most likely due to menopause  4. Shortness of Breath No  5. Chest Pain No  6. Unintentional weight loss  No  7. Unexplained fatigue (very tired for no reason) No   Objective:   Physical Exam        Assessment & Plan:

## 2012-08-09 ENCOUNTER — Ambulatory Visit (INDEPENDENT_AMBULATORY_CARE_PROVIDER_SITE_OTHER): Payer: BC Managed Care – PPO

## 2012-08-09 DIAGNOSIS — Z0389 Encounter for observation for other suspected diseases and conditions ruled out: Secondary | ICD-10-CM

## 2012-08-09 DIAGNOSIS — Z9289 Personal history of other medical treatment: Secondary | ICD-10-CM

## 2014-01-15 ENCOUNTER — Ambulatory Visit (INDEPENDENT_AMBULATORY_CARE_PROVIDER_SITE_OTHER): Payer: BC Managed Care – PPO | Admitting: Family Medicine

## 2014-01-15 VITALS — BP 114/74 | HR 64 | Temp 97.7°F | Resp 16 | Ht 62.0 in | Wt 132.0 lb

## 2014-01-15 DIAGNOSIS — J45990 Exercise induced bronchospasm: Secondary | ICD-10-CM

## 2014-01-15 DIAGNOSIS — Z23 Encounter for immunization: Secondary | ICD-10-CM

## 2014-01-15 MED ORDER — FLUTICASONE-SALMETEROL 250-50 MCG/DOSE IN AEPB: 1.0000 | INHALATION_SPRAY | Freq: Two times a day (BID) | RESPIRATORY_TRACT | Status: DC

## 2014-01-15 NOTE — Progress Notes (Signed)
[facility] [address] [place] [address] [phone]  Date:  01/15/2014   Name:  Rhonda Cruz   DOB:  1965/07/30   MRN:  [id number]  PCP:  Hayden Rasmussen., MD    Chief Complaint: Medication Refill   History of Present Illness:  Rhonda Cruz is a 48 y.o. very pleasant female patient who presents with the following:  History of asthma. Here today for a medication refill.  Last seen here over a year ago; she had moved to [facility] for a year to further her education but is now back in [place].  She has been on advair for several years. It seems to be working ok for her.  She usually uses her proair just before exercise but otherwise does not need it.  She is generally healthy and does not smoke, would like a flu shot today  Patient Active Problem List   Diagnosis Date Noted  . Mass of breast, right upper outer quadrant 10/28/2010    Past Medical History  Diagnosis Date  . Asthma   . Fatigue   . Anemia   . Depression   . HSV infection   . HPV (human papilloma virus) infection     Past Surgical History  Procedure Laterality Date  . Cystectomy      knee  . Tmj arthroplasty    . Tonsillectomy and adenoidectomy    . Tmj arthroplasty    . Cervix lesion destruction    . Colonoscopy  1991    History  Substance Use Topics  . Smoking status: Never Smoker   . Smokeless tobacco: Never Used  . Alcohol Use: 0.0 oz/week    3-1 Glasses of wine per week     Comment: 1-3 drinks/ 2-3 days a week    Family History  Problem Relation Age of Onset  . Cancer Mother     breast  . Hypertension Father   . Cancer Father     skin  . Cancer Brother     signs of skin cancer   . Hypertension Brother     No Known Allergies  Medication list has been reviewed and updated.  Current Outpatient Prescriptions on File Prior to Visit  Medication Sig Dispense Refill  . atovaquone-proguanil (MALARONE) 250-100 MG TABS Take 1 tablet by mouth daily.  32 tablet  0   . fish oil-omega-3 fatty acids 1000 MG capsule Take 2 g by mouth daily.        . Fluticasone-Salmeterol (ADVAIR DISKUS) 250-50 MCG/DOSE AEPB Inhale 1 puff into the lungs every 12 (twelve) hours.  60 each  3  . ibuprofen (ADVIL,MOTRIN) 200 MG tablet Take 400 mg by mouth every 6 (six) hours as needed for pain.      . Maca Root 500 MG CAPS Take 1 capsule by mouth 2 (two) times daily.      . Vitamins-Lipotropics (VIT BALANCED B-100 PO) Take by mouth.       Marland Kitchen albuterol (PROVENTIL HFA;VENTOLIN HFA) 108 (90 BASE) MCG/ACT inhaler Inhale 2 puffs into the lungs every 6 (six) hours as needed for wheezing.  1 Inhaler  5  . ondansetron (ZOFRAN ODT) 4 MG disintegrating tablet Dissolve one pill in mouth every 4-6 hours as needed for nausea and vomiting  10 tablet  0  . predniSONE (STERAPRED UNI-PAK) 10 MG tablet Take 10 mg by mouth daily. Pt taking for root canal- last dose is Sunday 08/07/12      . tranexamic  acid (LYSTEDA) 650 MG TABS Take 2 tablets (1,300 mg total) by mouth 3 (three) times daily.  30 tablet  0   Current Facility-Administered Medications on File Prior to Visit  Medication Dose Route Frequency Provider Last Rate Last Dose  . hepatitis A virus (PF) vaccine (HAVRIX (PF)) 1440 EL U/ML injection 1,440 Units  1 mL Intramuscular Once Posey Boyer, MD      . measles, mumps and rubella vaccine (MMR) injection 0.5 mL  0.5 mL Subcutaneous Once Posey Boyer, MD      . TDaP Durwin Reges) injection 0.5 mL  0.5 mL Intramuscular Once Posey Boyer, MD        Review of Systems:  As per HPI- otherwise negative.   Physical Examination: Filed Vitals:   01/15/14 0811  BP: 114/74  Pulse: 64  Temp: 97.7 F (36.5 C)  Resp: 16   Filed Vitals:   01/15/14 0811  Height: '5\' 2"'  (1.575 m)  Weight: 132 lb (59.875 kg)   Body mass index is 24.14 kg/(m^2). Ideal Body Weight: Weight in (lb) to have BMI = 25: 136.4  GEN: WDWN, NAD, Non-toxic, A & O x 3, looks well HEENT: Atraumatic, Normocephalic. Neck  supple. No masses, No LAD. Ears and Nose: No external deformity. CV: RRR, No M/G/R. No JVD. No thrill. No extra heart sounds. PULM: CTA B, no wheezes, crackles, rhonchi. No retractions. No resp. distress. No accessory muscle use. EXTR: No c/c/e NEURO Normal gait.  PSYCH: Normally interactive. Conversant. Not depressed or anxious appearing.  Calm demeanor.    Assessment and Plan: Exercise-induced asthma - Plan: Fluticasone-Salmeterol (ADVAIR DISKUS) 250-50 MCG/DOSE AEPB  Immunization due - Plan: Flu Vaccine QUAD 36+ mos IM  Asthma, mostly exercised induced.  Refill adviar, flu shot.   Ok to RF albuterol when she asks, does not need now  Signed Lamar Blinks, MD

## 2014-01-15 NOTE — Patient Instructions (Addendum)
Continue to use your adviar as directed and albuterol as needed.  Have your drug store send [place] a request when you need more albuterol. Take care!

## 2014-05-11 ENCOUNTER — Ambulatory Visit (INDEPENDENT_AMBULATORY_CARE_PROVIDER_SITE_OTHER): Payer: BLUE CROSS/BLUE SHIELD

## 2014-05-11 ENCOUNTER — Ambulatory Visit (INDEPENDENT_AMBULATORY_CARE_PROVIDER_SITE_OTHER): Payer: BLUE CROSS/BLUE SHIELD | Admitting: Emergency Medicine

## 2014-05-11 ENCOUNTER — Telehealth: Payer: Self-pay | Admitting: Family Medicine

## 2014-05-11 VITALS — BP 100/72 | HR 65 | Temp 97.5°F | Resp 18 | Ht 62.0 in | Wt 133.0 lb

## 2014-05-11 DIAGNOSIS — J45990 Exercise induced bronchospasm: Secondary | ICD-10-CM

## 2014-05-11 DIAGNOSIS — R0602 Shortness of breath: Secondary | ICD-10-CM

## 2014-05-11 DIAGNOSIS — M545 Low back pain: Secondary | ICD-10-CM

## 2014-05-11 DIAGNOSIS — R062 Wheezing: Secondary | ICD-10-CM

## 2014-05-11 DIAGNOSIS — J4530 Mild persistent asthma, uncomplicated: Secondary | ICD-10-CM

## 2014-05-11 DIAGNOSIS — M25561 Pain in right knee: Secondary | ICD-10-CM

## 2014-05-11 DIAGNOSIS — Z3200 Encounter for pregnancy test, result unknown: Secondary | ICD-10-CM

## 2014-05-11 LAB — PULMONARY FUNCTION TEST

## 2014-05-11 LAB — POCT URINE PREGNANCY
Preg Test, Ur: POSITIVE
Preg Test, Ur: NEGATIVE

## 2014-05-11 LAB — HCG, QUANTITATIVE, PREGNANCY: hCG, Beta Chain, Quant, S: <2 m[IU]/mL

## 2014-05-11 MED ORDER — FLUTICASONE-SALMETEROL 250-50 MCG/DOSE IN AEPB: 1.0000 | INHALATION_SPRAY | Freq: Two times a day (BID) | RESPIRATORY_TRACT | Status: DC

## 2014-05-11 MED ORDER — ALBUTEROL SULFATE HFA 108 (90 BASE) MCG/ACT IN AERS: 2.0000 | INHALATION_SPRAY | Freq: Four times a day (QID) | RESPIRATORY_TRACT | Status: DC | PRN

## 2014-05-11 NOTE — Progress Notes (Addendum)
This chart was scribed for Rhonda Jordan, MD by Rhonda Cruz, ED Scribe. This patient was seen in room 3 and the patient's care was started at 8:48 AM.  Subjective:    Patient ID: Rhonda Cruz, female    DOB: 01/14/1966, 49 y.o.   MRN: [id number]  Chief Complaint  Patient presents with  . Shortness of Breath    the past 2 mths illness   . Hip Pain    moving down toward knees ever since injury in [place]   . Knee Pain    wakes up at night with pain in rt knee towards top of foot   . rx refills    would like to see if there is something beside advair due to cost, rx alb inhaler    HPI CLOIE Cruz is a 49 y.o. female with PMhx of asthma presents to [facility] with multiple complaints.  Pt states that she is scheduled to run a marathon in June for which she is training for. However, she has been experiencing bilateral hip pain and right knee pain that radiates down to the top of her foot. She does endorse a hx of a back injury during yoga. Admits to having mild low back pain. Pt has been training daily, running approximately 1-2 miles.   Last week she states that she was sick with common cold symptoms or the past 2 months intermittently. However, during the course of her illness her breathing has not returned to normal. It is causing her to experience SOB and mild chest tightness, especially when she runs. Pt takes Advair 250mg  daily once a day. For which she is seeking a medication refill for, but would like a cheaper option. She is also seeking a refill on her albuterol inhaler which she states is expired. Pt reports 2 periods in the past year and is not concerned about pregnancy.   Pt denies fever, neck pain, sore throat, visual disturbance, CP, abdominal pain, nausea, emesis, diarrhea, urinary symptoms, HA, weakness, numbness and rash as associated symptoms.     Patient Active Problem List   Diagnosis Date Noted  . Mass of breast, right upper outer quadrant 10/28/2010   Past Medical  History  Diagnosis Date  . Asthma   . Fatigue   . Anemia   . Depression   . HSV infection   . HPV (human papilloma virus) infection    Past Surgical History  Procedure Laterality Date  . Cystectomy      knee  . Tmj arthroplasty    . Tonsillectomy and adenoidectomy    . Tmj arthroplasty    . Cervix lesion destruction    . Colonoscopy  1991   No Known Allergies Prior to Admission medications   Medication Sig Start Date End Date Taking? Authorizing Provider  albuterol (PROVENTIL HFA;VENTOLIN HFA) 108 (90 BASE) MCG/ACT inhaler Inhale 2 puffs into the lungs every 6 (six) hours as needed for wheezing. 07/25/11 05/11/14 Yes Rhonda Russian, MD  fish oil-omega-3 fatty acids 1000 MG capsule Take 2 g by mouth daily.     Yes Historical Provider, MD  Fluticasone-Salmeterol (ADVAIR DISKUS) 250-50 MCG/DOSE AEPB Inhale 1 puff into the lungs every 12 (twelve) hours. 01/15/14  Yes Rhonda Filler Copland, MD  ibuprofen (ADVIL,MOTRIN) 200 MG tablet Take 400 mg by mouth every 6 (six) hours as needed for pain.   Yes Historical Provider, MD  Vitamins-Lipotropics (VIT BALANCED B-100 PO) Take by mouth.    Yes Historical Provider, MD  Rhonda Cruz  Root 500 MG CAPS Take 1 capsule by mouth 2 (two) times daily.    Historical Provider, MD  ondansetron (ZOFRAN ODT) 4 MG disintegrating tablet Dissolve one pill in mouth every 4-6 hours as needed for nausea and vomiting Patient not taking: Reported on 05/11/2014 08/29/11   Rhonda Boyer, MD  tranexamic acid (LYSTEDA) 650 MG TABS Take 2 tablets (1,300 mg total) by mouth 3 (three) times daily. Patient not taking: Reported on 05/11/2014 09/14/11   Rhonda Regal, PA-C   History   Social History  . Marital Status: Single    Spouse Name: N/A  . Number of Children: N/A  . Years of Education: N/A   Occupational History  . Not on file.   Social History Main Topics  . Smoking status: Never Smoker   . Smokeless tobacco: Never Used  . Alcohol Use: 0.0 oz/week    3-1 Glasses of wine  per week     Comment: 1-3 drinks/ 2-3 days a week  . Drug Use: No  . Sexual Activity: No   Other Topics Concern  . Not on file   Social History Narrative    Review of Systems  Constitutional: Negative for fever and chills.  HENT: Positive for congestion.   Respiratory: Positive for shortness of breath. Negative for cough.   Gastrointestinal: Negative for nausea, vomiting and constipation.  Genitourinary: Negative for dysuria, urgency, frequency, flank pain and difficulty urinating.  Musculoskeletal: Positive for back pain and arthralgias. Negative for myalgias, joint swelling and gait problem.  Skin: Negative for rash.  Neurological: Negative for weakness, numbness and headaches.   Objective:   Physical Exam  Constitutional: She appears well-developed and well-nourished. No distress.  HENT:  Head: Normocephalic and atraumatic.  Eyes: Conjunctivae are normal. Right eye exhibits no discharge. Left eye exhibits no discharge.  Neck: Neck supple.  Cardiovascular: Normal rate, regular rhythm and normal heart sounds.  Exam reveals no gallop and no friction rub.   No murmur heard. Pulmonary/Chest: Effort normal and breath sounds normal. No respiratory distress.  Rare expiratory wheeze noted.   Abdominal: Soft. She exhibits no distension. There is no tenderness.  Musculoskeletal: She exhibits tenderness.  Minimal tenderness over the mid lumbar spine. No motor weakness.   Right knee: there is no redness of swelling. No instability noted. Negative McMurray.   Neurological: She is alert. She has normal strength. She displays a negative Romberg sign. Coordination and gait normal.  Reflex Scores:      Tricep reflexes are 2+ on the right side and 2+ on the left side.      Bicep reflexes are 2+ on the right side and 2+ on the left side.      Brachioradialis reflexes are 2+ on the right side and 2+ on the left side.      Patellar reflexes are 2+ on the right side and 2+ on the left side.       Achilles reflexes are 2+ on the right side and 2+ on the left side. Skin: Skin is warm and dry.  Psychiatric: She has a normal mood and affect. Her behavior is normal. Thought content normal.  Nursing note and vitals reviewed.  Filed Vitals:   05/11/14 0820  BP: 100/72  Pulse: 65  Temp: 97.5 F (36.4 C)  TempSrc: Oral  Resp: 18  Height: 5\' 2"  (1.575 m)  Weight: 133 lb (60.328 kg)  SpO2: 96%   [facility] reading (PRIMARY) by  Dr. Everlene Farrier please, no possible stress reaction medial  condyle of the knee. Chest x-ray no acute disease in her LS-spine normal.  Assessment & Plan:  Patient initially had a pregnancy test there was initially read as negative but on repeat eval was questioned positive. She had her x-rays done at that time. There is a questionable stress reaction medial condyle on her knee films. A serum hCG was done to verify whether she is pregnant or not. A repeat urine test was done and it was negative. She does have a history of uterine fibroids.I personally performed the services described in this documentation, which was scribed in my presence. The recorded information has been reviewed and is accurate. Serum hCG returned negative

## 2014-05-11 NOTE — Telephone Encounter (Signed)
Called patient earlier this afternoon to notify her of negative HCG. She voiced understanding. She wanted to know if there was anything else she needed to know or do. Per Dr, Peri Maris indicated arthritis but nothing else, slowly advance exercises, take OTC Aleve, Motrin, or Advil, do this and give a couple weeks. If no improvement or she is not where she wants or needs to be then recheck with Dr. Everlene Farrier and they can discuss sports med. Patient notified and voiced understanding.

## 2014-05-11 NOTE — Patient Instructions (Signed)

## 2014-05-16 ENCOUNTER — Telehealth: Payer: Self-pay

## 2014-05-16 NOTE — Telephone Encounter (Signed)
I changed her inhaler to Pro-Air, insurance would not cover Proventil. [place] per [place].

## 2014-05-22 ENCOUNTER — Encounter: Payer: Self-pay | Admitting: Emergency Medicine

## 2014-07-06 ENCOUNTER — Other Ambulatory Visit: Payer: Self-pay | Admitting: Obstetrics and Gynecology

## 2014-07-06 DIAGNOSIS — N6320 Unspecified lump in the left breast, unspecified quadrant: Secondary | ICD-10-CM

## 2014-07-13 ENCOUNTER — Other Ambulatory Visit: Payer: BC Managed Care – PPO

## 2014-07-16 ENCOUNTER — Ambulatory Visit
Admission: RE | Admit: 2014-07-16 | Discharge: 2014-07-16 | Disposition: A | Discharge status: Home / Self Care | Payer: BC Managed Care – PPO | Source: Ambulatory Visit | Attending: Obstetrics and Gynecology | Admitting: Obstetrics and Gynecology

## 2014-07-16 DIAGNOSIS — N6320 Unspecified lump in the left breast, unspecified quadrant: Secondary | ICD-10-CM

## 2015-02-27 DIAGNOSIS — S61451A Open bite of right hand, initial encounter: Secondary | ICD-10-CM | POA: Insufficient documentation

## 2015-06-29 ENCOUNTER — Telehealth: Payer: Self-pay | Admitting: Radiology

## 2015-06-29 ENCOUNTER — Ambulatory Visit (INDEPENDENT_AMBULATORY_CARE_PROVIDER_SITE_OTHER): Payer: 59 | Admitting: Internal Medicine

## 2015-06-29 VITALS — BP 128/76 | HR 68 | Temp 97.8°F | Resp 16 | Ht 62.0 in | Wt 133.1 lb

## 2015-06-29 DIAGNOSIS — J45990 Exercise induced bronchospasm: Secondary | ICD-10-CM

## 2015-06-29 DIAGNOSIS — G4700 Insomnia, unspecified: Secondary | ICD-10-CM

## 2015-06-29 DIAGNOSIS — F329 Major depressive disorder, single episode, unspecified: Secondary | ICD-10-CM | POA: Diagnosis not present

## 2015-06-29 MED ORDER — FLUOXETINE HCL 10 MG PO TABS: ORAL_TABLET | ORAL | Status: DC

## 2015-06-29 MED ORDER — TRIAZOLAM 0.25 MG PO TABS: ORAL_TABLET | ORAL | Status: DC

## 2015-06-29 MED ORDER — FLUTICASONE-SALMETEROL 250-50 MCG/DOSE IN AEPB: 1.0000 | INHALATION_SPRAY | Freq: Two times a day (BID) | RESPIRATORY_TRACT | Status: DC

## 2015-06-29 NOTE — Telephone Encounter (Signed)
The pharmacy found Dr Laney Pastor sig confusing on the Halcion  Have clarified one at hs prn, patient knows to use this if she awakes early

## 2015-06-29 NOTE — Progress Notes (Signed)
Subjective:  By signing my name below, I, Moises Blood, attest that this documentation has been prepared under the direction and in the presence of Tami Lin, MD. Electronically Signed: Moises Blood, [place]. 06/29/2015 , 9:22 AM .  Patient was seen in Room 8 .   Patient ID: Rhonda Cruz, female    DOB: [birth year], 50 y.o.   MRN: [id number] Chief Complaint  Patient presents with   Medication Refill    refill of advair    Depression    wants to discuss starting on medication    HPI Rhonda Cruz is a 50 y.o. female [facility]. She has recently entered therapy for depression. She also needs a medication refill of advair. She states having a history of asthma.   Depression Depression screen [facility] 2/9 06/29/2015  Decreased Interest 0  Down, Depressed, Hopeless 1  PHQ - 2 Score 1   She wants to start on medication for Depression and anxiety. She reports trying holistic methods with counseling without much relief. She was put on lamictal about 9-10 years ago with a lot of side effects so she's been resistant against taking medications since then. She also tried zoloft over 10 years ago. Depression has become more of an issue over the last year because of multiple life changes and untoward events  She's been feeling very overwhelmed:  2 years ago, she quit her job, moved to [place] for schooling for a year.  Her aunt passed away, neighbor is suing her for "dog barking at her", found a mass in her breast, relationship troubles, and going through menopause. Breakup of a longtime relationship. anhedonism now.  She's been having sleep issues where she is able to sleep but unable to stay asleep. She would wake up and ruminate for hours. She's recently started running again for some relief.   Patient Active Problem List   Diagnosis Date Noted   Mass of breast, right upper outer quadrant 10/28/2010    Current outpatient  prescriptions:    albuterol (PROVENTIL HFA;VENTOLIN HFA) 108 (90 BASE) MCG/ACT inhaler, Inhale 2 puffs into the lungs every 6 (six) hours as needed for wheezing., Disp: 1 Inhaler, Rfl: 5   b complex vitamins tablet, Take 1 tablet by mouth daily., Disp: , Rfl:    fish oil-omega-3 fatty acids 1000 MG capsule, Take 2 g by mouth daily.  , Disp: , Rfl:    Fluticasone-Salmeterol (ADVAIR DISKUS) 250-50 MCG/DOSE AEPB, Inhale 1 puff into the lungs every 12 (twelve) hours., Disp: 60 each, Rfl: 11   ibuprofen (ADVIL,MOTRIN) 200 MG tablet, Take 400 mg by mouth every 6 (six) hours as needed for pain. Reported on 06/29/2015, Disp: , Rfl:    Maca Root 500 MG CAPS, Take 1 capsule by mouth 2 (two) times daily. Reported on 06/29/2015, Disp: , Rfl:    ondansetron (ZOFRAN ODT) 4 MG disintegrating tablet, Dissolve one pill in mouth every 4-6 hours as needed for nausea and vomiting (Patient not taking: Reported on 05/11/2014), Disp: 10 tablet, Rfl: 0   tranexamic acid (LYSTEDA) 650 MG TABS, Take 2 tablets (1,300 mg total) by mouth 3 (three) times daily. (Patient not taking: Reported on 05/11/2014), Disp: 30 tablet, Rfl: 0   Vitamins-Lipotropics (VIT BALANCED B-100 PO), Take by mouth. Reported on 06/29/2015, Disp: , Rfl:   Current facility-administered medications:    hepatitis A virus (PF) vaccine (HAVRIX (PF)) 1440 EL U/ML injection 1,440 Units, 1 mL, Intramuscular, Once, Fenton Malling  Linna Darner, MD   Review of Systems  Constitutional: Negative for fever, chills and fatigue.  Gastrointestinal: Negative for nausea, vomiting and diarrhea.  Neurological: Negative for dizziness, weakness and numbness.  Psychiatric/Behavioral: Positive for sleep disturbance and dysphoric mood. Negative for suicidal ideas and self-injury. The patient is not nervous/anxious.        Objective:   Physical Exam  Constitutional: She is oriented to person, place, and time. She appears well-developed and well-nourished. No distress.  HENT:  Head:  Normocephalic and atraumatic.  Eyes: EOM are normal. Pupils are equal, round, and reactive to light.  Neck: Neck supple.  Cardiovascular: Normal rate.   Pulmonary/Chest: Effort normal. No respiratory distress.  Musculoskeletal: Normal range of motion.  Neurological: She is alert and oriented to person, place, and time.  Skin: Skin is warm and dry.  Psychiatric:  Emotional/engaged/in tears at times during discussion/very thoughtful and perceptive No grandiosity/no flights of ideas/no delusions Thought content normal  Nursing note and vitals reviewed.   BP 128/76 mmHg   Pulse 68   Temp(Src) 97.8 F (36.6 C) (Oral)   Resp 16   Ht 5\' 2"  (1.575 m)   Wt 133 lb 2 oz (60.385 kg)   BMI 24.34 kg/m2   SpO2 96%    Assessment & Plan:  Reactive depression  Insomnia  Exercise-induced asthma - Plan: Fluticasone-Salmeterol (ADVAIR DISKUS) 250-50 MCG/DOSE AEPB  Meds ordered this encounter  Medications   b complex vitamins tablet    Sig: Take 1 tablet by mouth daily.   FLUoxetine (PROZAC) 10 MG tablet    Sig: One tab a day for 5 d then 2 tabs a day    Dispense:  60 tablet    Refill:  0   triazolam (HALCION) 0.25 MG tablet    Sig: One when wakes early    Dispense:  30 tablet    Refill:  0   Fluticasone-Salmeterol (ADVAIR DISKUS) 250-50 MCG/DOSE AEPB    Sig: Inhale 1 puff into the lungs every 12 (twelve) hours.    Dispense:  60 each    Refill:  11   Continue counseling Follow-up through my chart with office visit in 4 weeks for medication adjustment I have completed the patient encounter in its entirety as documented by the scribe, with editing by me where necessary. Robert P. Laney Pastor, M.D.

## 2015-06-29 NOTE — Patient Instructions (Signed)
     IF you received an x-ray today, you will receive an invoice from [facility]. Please contact  Radiology at [phone] with questions or concerns regarding your invoice.   IF you received labwork today, you will receive an invoice from [facility]. Please contact [facility] at [phone] with questions or concerns regarding your invoice.   Our billing staff will not be able to assist you with questions regarding bills from these companies.  You will be contacted with the lab results as soon as they are available. The fastest way to get your results is to activate your My Chart account. Instructions are located on the last page of this paperwork. If you have not heard from us regarding the results in 2 weeks, please contact this office.      

## 2015-07-24 ENCOUNTER — Ambulatory Visit (INDEPENDENT_AMBULATORY_CARE_PROVIDER_SITE_OTHER): Payer: 59 | Admitting: Internal Medicine

## 2015-07-24 VITALS — BP 116/68 | HR 60 | Temp 98.6°F | Resp 17 | Ht 62.0 in | Wt 130.0 lb

## 2015-07-24 DIAGNOSIS — F329 Major depressive disorder, single episode, unspecified: Secondary | ICD-10-CM

## 2015-07-24 MED ORDER — FLUOXETINE HCL 10 MG PO TABS: 20.0000 mg | ORAL_TABLET | Freq: Every day | ORAL | Status: DC

## 2015-07-24 NOTE — Patient Instructions (Signed)
     IF you received an x-ray today, you will receive an invoice from [facility]. Please contact [facility] at [phone] with questions or concerns regarding your invoice.   IF you received labwork today, you will receive an invoice from [facility]. Please contact [facility] at [phone] with questions or concerns regarding your invoice.   Our billing staff will not be able to assist you with questions regarding bills from these companies.  You will be contacted with the lab results as soon as they are available. The fastest way to get your results is to activate your My Chart account. Instructions are located on the last page of this paperwork. If you have not heard from us regarding the results in 2 weeks, please contact this office.      

## 2015-07-24 NOTE — Progress Notes (Signed)
By signing my name below I, Tereasa Coop, attest that this documentation has been prepared under the direction and in the presence of Tami Lin, MD. Electonically Signed. Tereasa Coop, Scribe 07/24/2015 at 9:18 AM  Subjective:    Patient ID: Rhonda Cruz, female    DOB: 06-10-1965, 50 y.o.   MRN: [id number]  Chief Complaint  Patient presents with  . Medication Refill    PROZAC    HPI Rhonda Cruz is a 50 y.o. female who presents to the [facility] requesting medication refill.   Pt states she has been much better this month and has also been sleeping much better. Pt states she has been running and seeing a Social worker. Pt denies any negative side-effects from prozac.   Pt also reports a chronic history of TMJ and states the TMJ worsened when she started fluoxetine. Pt denies difficulty chewing. Pt states that she cannot fully open her jaw. Her dentist now involved  Pt also state she has had a increased frequency of HAs this month. Pt states that she has stopped drinking coffee this month.   Pt is taking 20mg  prozac currently.   Pt states she is going to a music festival next weekend and states she is very excited about it. 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   Patient Active Problem List   Diagnosis Date Noted  . Insomnia 06/29/2015  . Exercise-induced asthma 06/29/2015  . Open bite of right hand 02/27/2015  . Mass of breast, right upper outer quadrant 10/28/2010    Current outpatient prescriptions:  .  albuterol (PROVENTIL HFA;VENTOLIN HFA) 108 (90 BASE) MCG/ACT inhaler, Inhale 2 puffs into the lungs every 6 (six) hours as needed for wheezing., Disp: 1 Inhaler, Rfl: 5 .  b complex vitamins tablet, Take 1 tablet by mouth daily., Disp: , Rfl:  .  fish oil-omega-3 fatty acids 1000 MG capsule, Take 2 g by mouth daily.  , Disp: , Rfl:  .  FLUoxetine (PROZAC) 10 MG tablet, One tab a day for 5 d then 2  tabs a day, Disp: 60 tablet, Rfl: 0 .  Fluticasone-Salmeterol (ADVAIR DISKUS) 250-50 MCG/DOSE AEPB, Inhale 1 puff into the lungs every 12 (twelve) hours., Disp: 60 each, Rfl: 11 .  ibuprofen (ADVIL,MOTRIN) 200 MG tablet, Take 400 mg by mouth every 6 (six) hours as needed for pain. Reported on 07/24/2015, Disp: , Rfl:  .  Maca Root 500 MG CAPS, Take 1 capsule by mouth 2 (two) times daily. Reported on 07/24/2015, Disp: , Rfl:  .  ondansetron (ZOFRAN ODT) 4 MG disintegrating tablet, Dissolve one pill in mouth every 4-6 hours as needed for nausea and vomiting (Patient not taking: Reported on 05/11/2014), Disp: 10 tablet, Rfl: 0 .  tranexamic acid (LYSTEDA) 650 MG TABS, Take 2 tablets (1,300 mg total) by mouth 3 (three) times daily. (Patient not taking: Reported on 05/11/2014), Disp: 30 tablet, Rfl: 0 .  triazolam (HALCION) 0.25 MG tablet, One when wakes early (Patient not taking: Reported on 07/24/2015), Disp: 30 tablet, Rfl: 0 .  Vitamins-Lipotropics (VIT BALANCED B-100 PO), Take by mouth. Reported on 07/24/2015, Disp: , Rfl:   Current facility-administered medications:  .  hepatitis A virus (PF) vaccine (HAVRIX (PF)) 1440 EL U/ML injection 1,440 Units, 1 mL, Intramuscular, Once, Posey Boyer, MD  No Known Allergies   Review of Systems  Neurological: Positive for headaches.  Psychiatric/Behavioral: Negative for sleep disturbance.  Objective:   Physical Exam  Constitutional: She is oriented to person, place, and time. She appears well-developed and well-nourished. No distress.  HENT:  Head: Normocephalic and atraumatic.  Mouth/Throat: Oropharynx is clear and moist.  tmj R nontender and not obv dislocated. Some restricted opening/not swollen  Eyes: Conjunctivae are normal. Pupils are equal, round, and reactive to light.  Neck: Neck supple.  Cardiovascular: Normal rate.   Pulmonary/Chest: Effort normal.  Musculoskeletal: Normal range of motion.  Neurological: She is alert and oriented to  person, place, and time. Gait normal.  Skin: Skin is warm and dry.  Psychiatric: She has a normal mood and affect. Her behavior is normal.  Nursing note and vitals reviewed.    Filed Vitals:   07/24/15 0842  BP: 116/68  Pulse: 60  Temp: 98.6 F (37 C)  TempSrc: Oral  Resp: 17  Height: 5\' 2"  (1.575 m)  Weight: 130 lb (58.968 kg)  SpO2: 99%        Assessment & Plan:  Reactive depression  Cont prozac 20 6-12 months while continuing therapy and life changes. Use 10mg  tabs as option to decrease to 10 if HAs continue would bo ok to try   I have completed the patient encounter in its entirety as documented by the scribe, with editing by me where necessary. Debe Anfinson P. Laney Pastor, M.D.

## 2015-09-23 ENCOUNTER — Ambulatory Visit (INDEPENDENT_AMBULATORY_CARE_PROVIDER_SITE_OTHER): Payer: 59 | Admitting: Internal Medicine

## 2015-09-23 VITALS — BP 122/74 | HR 81 | Temp 98.2°F | Resp 16 | Ht 62.0 in | Wt 131.0 lb

## 2015-09-23 DIAGNOSIS — J4530 Mild persistent asthma, uncomplicated: Secondary | ICD-10-CM | POA: Diagnosis not present

## 2015-09-23 DIAGNOSIS — F329 Major depressive disorder, single episode, unspecified: Secondary | ICD-10-CM

## 2015-09-23 DIAGNOSIS — J45990 Exercise induced bronchospasm: Secondary | ICD-10-CM

## 2015-09-23 MED ORDER — FLUOXETINE HCL 10 MG PO TABS: 20.0000 mg | ORAL_TABLET | Freq: Every day | ORAL | Status: DC

## 2015-09-23 MED ORDER — FLUTICASONE-SALMETEROL 250-50 MCG/DOSE IN AEPB: 1.0000 | INHALATION_SPRAY | Freq: Two times a day (BID) | RESPIRATORY_TRACT | Status: DC

## 2015-09-23 MED ORDER — ALBUTEROL SULFATE HFA 108 (90 BASE) MCG/ACT IN AERS: 2.0000 | INHALATION_SPRAY | Freq: Four times a day (QID) | RESPIRATORY_TRACT | Status: DC | PRN

## 2015-09-23 NOTE — Progress Notes (Signed)
Chief Complaint  Patient presents with  . Medication Refill     prozac/ pt would like to discuss a different medication  actually just had questions about whether this med would make her more sensitive to insect bites/stings as she has had more response than usual Doing well from standpoint of depr--able to handle stressors better-continues with therapy HAs have reolved so continues proz 20mg (2x10) Has new job x 1 yr waiting to see if this fits Travelling a lot-still working on yet another degree  Needs refills on asthma meds as well as prozac//asthma asymptomatic currently  EX- BP 122/74 mmHg  Pulse 81  Temp(Src) 98.2 F (36.8 C) (Oral)  Resp 16  Ht 5\' 2"  (1.575 m)  Wt 131 lb (59.421 kg)  BMI 23.95 kg/m2  SpO2 97% Mood good, affect appr, thought content normal Judgement sound  IMP Reactive depression  Extrinsic asthma, mild persistent, uncomplicated - Plan: albuterol (PROVENTIL HFA;VENTOLIN HFA) 108 (90 Base) MCG/ACT inhaler  Exercise-induced asthma - Plan: Fluticasone-Salmeterol (ADVAIR DISKUS) 250-50 MCG/DOSE AEPB  Meds ordered this encounter  Medications  . FLUoxetine (PROZAC) 10 MG tablet    Sig: Take 2 tablets (20 mg total) by mouth daily.    Dispense:  180 tablet    Refill:  3  . albuterol (PROVENTIL HFA;VENTOLIN HFA) 108 (90 Base) MCG/ACT inhaler    Sig: Inhale 2 puffs into the lungs every 6 (six) hours as needed for wheezing.    Dispense:  1 Inhaler    Refill:  5  . Fluticasone-Salmeterol (ADVAIR DISKUS) 250-50 MCG/DOSE AEPB    Sig: Inhale 1 puff into the lungs every 12 (twelve) hours.    Dispense:  60 each    Refill:  11   F/u CPE here soon--has names for f/u PCP

## 2015-09-23 NOTE — Patient Instructions (Addendum)
     IF you received an x-ray today, you will receive an invoice from [facility]. Please contact [facility] at [phone] with questions or concerns regarding your invoice.   IF you received labwork today, you will receive an invoice from [employer]. Please contact [facility] at [phone] with questions or concerns regarding your invoice.   Our billing staff will not be able to assist you with questions regarding bills from these companies.  You will be contacted with the lab results as soon as they are available. The fastest way to get your results is to activate your My Chart account. Instructions are located on the last page of this paperwork. If you have not heard from [place] regarding the results in 2 weeks, please contact this office.     Harrison Mons or Posey Rea PAs with me for more than a decade

## 2015-11-14 ENCOUNTER — Encounter: Payer: Self-pay | Admitting: Physician Assistant

## 2015-11-14 ENCOUNTER — Ambulatory Visit (INDEPENDENT_AMBULATORY_CARE_PROVIDER_SITE_OTHER): Payer: 59 | Admitting: Physician Assistant

## 2015-11-14 VITALS — BP 112/70 | HR 68 | Temp 97.7°F | Resp 18 | Ht 62.0 in | Wt 132.2 lb

## 2015-11-14 DIAGNOSIS — Z23 Encounter for immunization: Secondary | ICD-10-CM | POA: Diagnosis not present

## 2015-11-14 DIAGNOSIS — Z1329 Encounter for screening for other suspected endocrine disorder: Secondary | ICD-10-CM | POA: Diagnosis not present

## 2015-11-14 DIAGNOSIS — Z13228 Encounter for screening for other metabolic disorders: Secondary | ICD-10-CM | POA: Diagnosis not present

## 2015-11-14 DIAGNOSIS — Z1231 Encounter for screening mammogram for malignant neoplasm of breast: Secondary | ICD-10-CM | POA: Diagnosis not present

## 2015-11-14 DIAGNOSIS — Z0000 Encounter for general adult medical examination without abnormal findings: Secondary | ICD-10-CM

## 2015-11-14 DIAGNOSIS — Z7189 Other specified counseling: Secondary | ICD-10-CM | POA: Diagnosis not present

## 2015-11-14 DIAGNOSIS — IMO0002 Reserved for concepts with insufficient information to code with codable children: Secondary | ICD-10-CM

## 2015-11-14 DIAGNOSIS — Z13220 Encounter for screening for lipoid disorders: Secondary | ICD-10-CM

## 2015-11-14 DIAGNOSIS — Z139 Encounter for screening, unspecified: Secondary | ICD-10-CM

## 2015-11-14 DIAGNOSIS — Z130 Encounter for screening for diseases of the blood and blood-forming organs and certain disorders involving the immune mechanism: Secondary | ICD-10-CM

## 2015-11-14 DIAGNOSIS — Z1211 Encounter for screening for malignant neoplasm of colon: Secondary | ICD-10-CM | POA: Diagnosis not present

## 2015-11-14 DIAGNOSIS — Z114 Encounter for screening for human immunodeficiency virus [HIV]: Secondary | ICD-10-CM | POA: Diagnosis not present

## 2015-11-14 DIAGNOSIS — Z0184 Encounter for antibody response examination: Secondary | ICD-10-CM

## 2015-11-14 DIAGNOSIS — Z1389 Encounter for screening for other disorder: Secondary | ICD-10-CM

## 2015-11-14 LAB — CBC WITH DIFFERENTIAL/PLATELET
BASOS PCT: 1 %
Basophils Absolute: 46 cells/uL (ref 0–200)
EOS ABS: 276 {cells}/uL (ref 15–500)
Eosinophils Relative: 6 %
HEMATOCRIT: 41.2 % (ref 35.0–45.0)
HEMOGLOBIN: 13.7 g/dL (ref 11.7–15.5)
LYMPHS ABS: 1748 {cells}/uL (ref 850–3900)
Lymphocytes Relative: 38 %
MCH: 28.7 pg (ref 27.0–33.0)
MCHC: 33.3 g/dL (ref 32.0–36.0)
MCV: 86.4 fL (ref 80.0–100.0)
MONO ABS: 368 {cells}/uL (ref 200–950)
MPV: 9.1 fL (ref 7.5–12.5)
Monocytes Relative: 8 %
NEUTROS ABS: 2162 {cells}/uL (ref 1500–7800)
Neutrophils Relative %: 47 %
Platelets: 343 10*3/uL (ref 140–400)
RBC: 4.77 MIL/uL (ref 3.80–5.10)
RDW: 12.9 % (ref 11.0–15.0)
WBC: 4.6 10*3/uL (ref 3.8–10.8)

## 2015-11-14 LAB — LIPID PANEL
CHOL/HDL RATIO: 2.2 ratio (ref <=5.0)
CHOLESTEROL: 237 mg/dL — AB (ref 125–200)
HDL: 107 mg/dL (ref >=46)
LDL Cholesterol: 123 mg/dL (ref <130)
Triglycerides: 33 mg/dL (ref <150)
VLDL: 7 mg/dL (ref <30)

## 2015-11-14 LAB — POCT URINALYSIS DIP (MANUAL ENTRY)
BILIRUBIN UA: NEGATIVE
Glucose, UA: NEGATIVE
Ketones, POC UA: NEGATIVE
LEUKOCYTES UA: NEGATIVE
NITRITE UA: NEGATIVE
PH UA: 6.5
RBC UA: NEGATIVE
Spec Grav, UA: 1.025
UROBILINOGEN UA: 0.2

## 2015-11-14 LAB — COMPREHENSIVE METABOLIC PANEL
ALK PHOS: 30 U/L — AB (ref 33–115)
ALT: 13 U/L (ref 6–29)
AST: 17 U/L (ref 10–35)
Albumin: 4.6 g/dL (ref 3.6–5.1)
BUN: 20 mg/dL (ref 7–25)
CO2: 27 mmol/L (ref 20–31)
Calcium: 9.3 mg/dL (ref 8.6–10.2)
Chloride: 104 mmol/L (ref 98–110)
Creat: 0.71 mg/dL (ref 0.50–1.10)
Glucose, Bld: 93 mg/dL (ref 65–99)
Potassium: 4.5 mmol/L (ref 3.5–5.3)
Sodium: 140 mmol/L (ref 135–146)
TOTAL PROTEIN: 7.1 g/dL (ref 6.1–8.1)
Total Bilirubin: 0.7 mg/dL (ref 0.2–1.2)

## 2015-11-14 LAB — POC MICROSCOPIC URINALYSIS (UMFC): Mucus: ABSENT

## 2015-11-14 LAB — TSH: TSH: 1.22 mIU/L

## 2015-11-14 LAB — HEPATITIS B SURFACE ANTIBODY, QUANTITATIVE: Hepatitis B-Post: 1000 m[IU]/mL

## 2015-11-14 MED ORDER — TYPHOID VACCINE PO CPDR: 1.0000 | DELAYED_RELEASE_CAPSULE | ORAL | 0 refills | Status: DC

## 2015-11-14 MED ORDER — DOXYCYCLINE HYCLATE 100 MG PO CAPS: 100.0000 mg | ORAL_CAPSULE | Freq: Two times a day (BID) | ORAL | 0 refills | Status: AC

## 2015-11-14 NOTE — Progress Notes (Signed)
Patient ID: Rhonda Cruz, female    DOB: 08/28/1965, 50 y.o.   MRN: [id number]  PCP: No primary care provider on file.  Chief Complaint  Patient presents with  . Annual Exam    CPE. Pt. reports she is travelling to [place] next month. She wants to double check immunizations.     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   Cervical Cancer Screening: 02/2015 Breast Cancer Screening: CBE 02/2015 Colorectal Cancer Screening: not yet a candidate, though she turns 50 in 02/2016. Had a colonoscopy previously for evaluation of possible IBS. Bone Density Testing: not yet a candidate HIV Screening: not yet STI Screening: very low risk, tested fall 2016 Seasonal Influenza Vaccination: today Td/Tdap Vaccination: 08/2011 Pneumococcal Vaccination: not yet a candidate Zoster Vaccination: not yet a candidate Frequency of Dental evaluation: Q6 months Frequency of Eye evaluation: due for a visit  She is traveling to [place] next month and needs malaria prophylaxis and typhoid vaccination. She believes that she completed the Hep B vaccine series, though we have only one dose documented here.    Patient Active Problem List   Diagnosis Date Noted  . Reactive depression 07/24/2015  . Insomnia 06/29/2015  . Exercise-induced asthma 06/29/2015  . Mass of breast, right upper outer quadrant 10/28/2010    Past Medical History:  Diagnosis Date  . Anemia   . Asthma   . Depression   . Fatigue   . Fibroids   . HPV (human papilloma virus) infection   . HSV infection      Prior to Admission medications   Medication Sig Start Date End Date Taking? Authorizing Provider  albuterol (PROVENTIL HFA;VENTOLIN HFA) 108 (90 Base) MCG/ACT inhaler Inhale 2 puffs into the lungs every 6 (six) hours as needed for wheezing. 09/23/15 07/10/18 Yes Leandrew Koyanagi, MD  b complex vitamins tablet Take 1 tablet by mouth daily.   Yes Historical Provider, MD  fish oil-omega-3 fatty acids 1000 MG capsule  Take 2 g by mouth daily.     Yes Historical Provider, MD  FLUoxetine (PROZAC) 10 MG tablet Take 2 tablets (20 mg total) by mouth daily. 09/23/15  Yes Leandrew Koyanagi, MD  Fluticasone-Salmeterol (ADVAIR DISKUS) 250-50 MCG/DOSE AEPB Inhale 1 puff into the lungs every 12 (twelve) hours. 09/23/15  Yes Leandrew Koyanagi, MD  Maca Root 500 MG CAPS Take 1 capsule by mouth 2 (two) times daily. Reported on 07/24/2015   Yes Historical Provider, MD  triazolam (HALCION) 0.25 MG tablet One when wakes early 06/29/15  Yes Leandrew Koyanagi, MD  ibuprofen (ADVIL,MOTRIN) 200 MG tablet Take 400 mg by mouth every 6 (six) hours as needed for pain. Reported on 09/23/2015    Historical Provider, MD  ondansetron (ZOFRAN ODT) 4 MG disintegrating tablet Dissolve one pill in mouth every 4-6 hours as needed for nausea and vomiting Patient not taking: Reported on 05/11/2014 08/29/11   Posey Boyer, MD  tranexamic acid (LYSTEDA) 650 MG TABS Take 2 tablets (1,300 mg total) by mouth 3 (three) times daily. Patient not taking: Reported on 05/11/2014 09/14/11   Earnstine Regal, PA-C    No Known Allergies  Past Surgical History:  Procedure Laterality Date  . CERVIX LESION DESTRUCTION    . COLONOSCOPY  1991  . CYSTECTOMY     knee  . TMJ ARTHROPLASTY Bilateral 1988  . TONSILLECTOMY AND ADENOIDECTOMY      Family History  Problem Relation Age of Onset  . Cancer Mother 43  breast, s/p lumpectomy  . Asthma Mother   . Hypertension Father   . Cancer Father     skin  . Skin cancer Father   . Cancer Brother     signs of skin cancer   . Hypertension Brother   . Depression Brother     Social History   Social History  . Marital status: Divorced    Spouse name: n/a  . Number of children: 0  . Years of education: Master's +   Occupational History  . 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 Smoking status: Never Smoker  . Smokeless tobacco: Never Used  . Alcohol use 0.0 oz/week    3 - 1  Glasses of wine per week     Comment: 1-3 drinks/ 2-3 days a week  . Drug use: No  . Sexual activity: Not Currently    Partners: Male    Birth control/ protection: Abstinence   Other Topics Concern  . None   Social History Narrative   3 Master's Degrees: Scientist, research (life sciences), Med Theater Ed, Edm Arboriculturist and Psychology   Lives alone.   Family lives locally       Review of Systems  Constitutional: Negative.   HENT: Negative.   Eyes: Negative.   Respiratory: Negative.   Cardiovascular: Negative.   Gastrointestinal: Negative.   Genitourinary: Negative.   Musculoskeletal: Negative.   Skin: Negative.   Neurological: Negative.   Psychiatric/Behavioral: Negative.         Objective:  Physical Exam  Constitutional: She is oriented to person, place, and time. Vital signs are normal. She appears well-developed and well-nourished. She is active and cooperative. No distress.  BP 112/70   Pulse 68   Temp 97.7 F (36.5 C) (Oral)   Resp 18   Ht 5\' 2"  (1.575 m)   Wt 132 lb 3.2 oz (60 kg)   LMP 11/14/2014   SpO2 99%   BMI 24.18 kg/m    HENT:  Head: Normocephalic and atraumatic.  Right Ear: Hearing, tympanic membrane, external ear and ear canal normal. No foreign bodies.  Left Ear: Hearing, tympanic membrane, external ear and ear canal normal. No foreign bodies.  Nose: Nose normal.  Mouth/Throat: Uvula is midline, oropharynx is clear and moist and mucous membranes are normal. No oral lesions. Normal dentition. No dental abscesses or uvula swelling. No oropharyngeal exudate.  Eyes: Conjunctivae, EOM and lids are normal. Pupils are equal, round, and reactive to light. Right eye exhibits no discharge. Left eye exhibits no discharge. No scleral icterus.  Fundoscopic exam:      The right eye shows no arteriolar narrowing, no AV nicking, no exudate, no hemorrhage and no papilledema. The right eye shows red reflex.       The left eye shows no arteriolar narrowing, no  AV nicking, no exudate, no hemorrhage and no papilledema. The left eye shows red reflex.  Neck: Trachea normal, normal range of motion and full passive range of motion without pain. Neck supple. No spinous process tenderness and no muscular tenderness present. No thyroid mass and no thyromegaly present.  Cardiovascular: Normal rate, regular rhythm, normal heart sounds, intact distal pulses and normal pulses.   Pulmonary/Chest: Effort normal and breath sounds normal.  Abdominal: Soft. Bowel sounds are normal.  Musculoskeletal: She exhibits no edema or tenderness.       Cervical back: Normal.       Thoracic back: Normal.  Lumbar back: Normal.  Lymphadenopathy:       Head (right side): No tonsillar, no preauricular, no posterior auricular and no occipital adenopathy present.       Head (left side): No tonsillar, no preauricular, no posterior auricular and no occipital adenopathy present.    She has no cervical adenopathy.       Right: No supraclavicular adenopathy present.       Left: No supraclavicular adenopathy present.  Neurological: She is alert and oriented to person, place, and time. She has normal strength and normal reflexes. No cranial nerve deficit. She exhibits normal muscle tone. Coordination and gait normal.  Skin: Skin is warm, dry and intact. No rash noted. She is not diaphoretic. No cyanosis or erythema. Nails show no clubbing.  Psychiatric: She has a normal mood and affect. Her speech is normal and behavior is normal. Judgment and thought content normal.           Assessment & Plan:  1. Annual physical exam Age appropriate anticipatory guidance provided.  2. Need for influenza vaccination Declines. Encouraged her to return if she changes her mind.  3. Screening for HIV (human immunodeficiency virus) Completed, but no documentation available.  4. Encounter for screening mammogram for breast cancer She will schedule.  5. Screening for colon cancer - Ambulatory  referral to Gastroenterology  6. Screening for deficiency anemia - CBC with Differential/Platelet  7. Screening for hyperlipidemia - Lipid panel  8. Screening for thyroid disorder - TSH  9. Screening for metabolic disorder - Comprehensive metabolic panel  10. Advice or immunization for travel Start Vivotif now. Start Doxycycline Monday 11/18/2015, and continue for 4 weeks following return. - typhoid (VIVOTIF) DR capsule; Take 1 capsule by mouth every other day. X 4 doses.  Dispense: 4 capsule; Refill: 0 - doxycycline (VIBRAMYCIN) 100 MG capsule; Take 1 capsule (100 mg total) by mouth 2 (two) times daily.  Dispense: 46 capsule; Refill: 0  11. Immunity status testing If negative, needs repeat series. - Hepatitis B surface antibody  12. Screening for blood or protein in urine - POCT urinalysis dipstick - POCT Microscopic Urinalysis ([facility])   Fara Chute, PA-C Physician Assistant-Certified [facility]

## 2015-11-14 NOTE — Patient Instructions (Addendum)
IF you received an x-ray today, you will receive an invoice from [facility]. Please contact [facility] at [phone] with questions or concerns regarding your invoice.   IF you received labwork today, you will receive an invoice from [employer]. Please contact [facility] at [phone] with questions or concerns regarding your invoice.   Our billing staff will not be able to assist you with questions regarding bills from these companies.  You will be contacted with the lab results as soon as they are available. The fastest way to get your results is to activate your My Chart account. Instructions are located on the last page of this paperwork. If you have not heard from [place] regarding the results in 2 weeks, please contact this office.    We recommend that you schedule a mammogram for breast cancer screening. Typically, you do not need a referral to do this. Please contact a local imaging center to schedule your mammogram.  [facility] - [phone]  *ask for the Radiology [place]) - [phone] or [phone]  [facility] - [phone] [place] [phone] [facility] - [phone]  *ask for the [facility] - [phone]  *ask for the Radiology Department [facility] - [phone]  *ask for the [place] [phone]  [facility]  [address], [place], [place] [address]  Phone: [phone]  [facility] [place], [place], [place] [address]  Phone: [phone]  [facility] [address] [place], [place] [address]  Phone: [phone]   Keeping You Healthy  Get These Tests  Blood Pressure- Have your blood pressure checked by your healthcare provider at least once a year.  Normal blood pressure is 120/80.  Weight- Have  your body mass index (BMI) calculated to screen for obesity.  BMI is a measure of body fat based on height and weight.  You can calculate your own BMI at [employer]velB[employer].it  Cholesterol- Have your cholesterol checked every year.  Diabetes- Have your blood sugar checked every year if you have high blood pressure, high cholesterol, a family history of diabetes or if you are overweight.  Pap Test - Have a pap test every 1 to 5 years if you have been sexually active.  If you are older than 65 and recent pap tests have been normal you may not need additional pap tests.  In addition, if you have had a hysterectomy  for benign disease additional pap tests are not necessary.  Mammogram-Yearly mammograms are essential for early detection of breast cancer  Screening for Colon Cancer- Colonoscopy starting at age 61. Screening may begin sooner depending on your family history and other health conditions.  Follow up colonoscopy as directed by your Gastroenterologist.  Screening for Osteoporosis- Screening begins at age 32 with bone density scanning, sooner if you are at higher risk for developing Osteoporosis.  Get these medicines  Calcium with Vitamin D- Your body requires 1200-1500 mg of Calcium a day and [phone] IU of Vitamin D a day.  You can only absorb 500 mg of Calcium at a time therefore Calcium must be taken in 2 or 3 separate doses throughout the day.  Hormones- Hormone therapy has been associated with increased risk for certain cancers and heart disease.  Talk to your healthcare provider about if you need relief from menopausal symptoms.  Aspirin- Ask your healthcare provider about taking Aspirin to prevent Heart Disease and Stroke.  Get these Immuniztions  Flu shot- Every fall  Pneumonia shot- Once after the age of 80; if you are younger ask your healthcare provider if you need a pneumonia shot.  Tetanus- Every ten years.  Zostavax- Once after the age of 55 to prevent  shingles.  Take these steps  Don't smoke- Your healthcare provider can help you quit. For tips on how to quit, ask your healthcare provider or go to www.smokefree.gov or call 1-800 QUIT-NOW.  Be physically active- Exercise 5 days a week for a minimum of 30 minutes.  If you are not already physically active, start slow and gradually work up to 30 minutes of moderate physical activity.  Try walking, dancing, bike riding, swimming, etc.  Eat a healthy diet- Eat a variety of healthy foods such as fruits, vegetables, whole grains, low fat milk, low fat cheeses, yogurt, lean meats, chicken, fish, eggs, dried beans, tofu, etc.  For more information go to www.thenutritionsource.org  Dental visit- Brush and floss teeth twice daily; visit your dentist twice a year.  Eye exam- Visit your Optometrist or Ophthalmologist yearly.  Drink alcohol in moderation- Limit alcohol intake to one drink or less a day.  Never drink and drive.  Depression- Your emotional health is as important as your physical health.  If you're feeling down or losing interest in things you normally enjoy, please talk to your healthcare provider.  Seat Belts- can save your life; always wear one  Smoke/Carbon Monoxide detectors- These detectors need to be installed on the appropriate level of your home.  Replace batteries at least once a year.  Violence- If anyone is threatening or hurting you, please tell your healthcare provider.  Living Will/ Health care power of attorney- Discuss with your healthcare provider and family.

## 2015-11-15 ENCOUNTER — Encounter: Payer: Self-pay | Admitting: Physician Assistant

## 2015-11-15 DIAGNOSIS — Z789 Other specified health status: Secondary | ICD-10-CM | POA: Insufficient documentation

## 2016-02-21 ENCOUNTER — Telehealth: Payer: Self-pay

## 2016-02-21 NOTE — Telephone Encounter (Signed)
Patient dropped off Wellness Exam Form for completion.   Put in Nurses Box.   BCN is [phone]

## 2016-02-25 ENCOUNTER — Other Ambulatory Visit: Payer: Self-pay

## 2016-02-25 NOTE — Telephone Encounter (Signed)
Form for Proof of annual wellness exam put in [name]'s box.

## 2016-02-28 ENCOUNTER — Telehealth: Payer: Self-pay | Admitting: Emergency Medicine

## 2016-02-28 NOTE — Telephone Encounter (Signed)
Left message informing patient paper work completed and ready for pick-up.

## 2016-05-18 ENCOUNTER — Ambulatory Visit (INDEPENDENT_AMBULATORY_CARE_PROVIDER_SITE_OTHER): Payer: 59 | Admitting: Physician Assistant

## 2016-05-18 VITALS — BP 120/70 | HR 76 | Temp 98.3°F | Resp 18 | Ht 62.0 in | Wt 134.0 lb

## 2016-05-18 DIAGNOSIS — F329 Major depressive disorder, single episode, unspecified: Secondary | ICD-10-CM | POA: Diagnosis not present

## 2016-05-18 DIAGNOSIS — Z1211 Encounter for screening for malignant neoplasm of colon: Secondary | ICD-10-CM | POA: Diagnosis not present

## 2016-05-18 DIAGNOSIS — J029 Acute pharyngitis, unspecified: Secondary | ICD-10-CM | POA: Diagnosis not present

## 2016-05-18 DIAGNOSIS — L309 Dermatitis, unspecified: Secondary | ICD-10-CM

## 2016-05-18 DIAGNOSIS — T753XXA Motion sickness, initial encounter: Secondary | ICD-10-CM | POA: Diagnosis not present

## 2016-05-18 MED ORDER — FLUOXETINE HCL 10 MG PO TABS: 20.0000 mg | ORAL_TABLET | Freq: Every day | ORAL | 3 refills | Status: DC

## 2016-05-18 MED ORDER — ONDANSETRON 4 MG PO TBDP
ORAL_TABLET | ORAL | 0 refills | Status: DC
Start: 2016-05-18 — End: 2017-01-28

## 2016-05-18 NOTE — Patient Instructions (Signed)
Expect a call for your GI referral. If you don't hear from them in two weeks contact the office here and we will assist you. Use Hydrocortisone for the rash. If it gets worse please return for evaluation.    Contact Dermatitis Introduction Dermatitis is redness, soreness, and swelling (inflammation) of the skin. Contact dermatitis is a reaction to certain substances that touch the skin. You either touched something that irritated your skin, or you have allergies to something you touched. Follow these instructions at home: [place] your skin as needed.  Apply cool compresses to the affected areas.  Try taking a bath with:  Epsom salts. Follow the instructions on the package. You can get these at a pharmacy or grocery store.  Baking soda. Pour a small amount into the bath as told by your doctor.  Colloidal oatmeal. Follow the instructions on the package. You can get this at a pharmacy or grocery store.  Try applying baking soda paste to your skin. Stir water into baking soda until it looks like paste.  Do not scratch your skin.  Bathe less often.  Bathe in lukewarm water. Avoid using hot water. Medicines  Take or apply over-the-counter and prescription medicines only as told by your doctor.  If you were prescribed an antibiotic medicine, take or apply your antibiotic as told by your doctor. Do not stop taking the antibiotic even if your condition starts to get better. General instructions  Keep all follow-up visits as told by your doctor. This is important.  Avoid the substance that caused your reaction. If you do not know what caused it, keep a journal to try to track what caused it. Write down:  What you eat.  What cosmetic products you use.  What you drink.  What you wear in the affected area. This includes jewelry.  If you were given a bandage (dressing), take care of it as told by your doctor. This includes when to change and remove it. Contact a  doctor if:  You do not get better with treatment.  Your condition gets worse.  You have signs of infection such as:  Swelling.  Tenderness.  Redness.  Soreness.  Warmth.  You have a fever.  You have new symptoms. Get help right away if:  You have a very bad headache.  You have neck pain.  Your neck is stiff.  You throw up (vomit).  You feel very sleepy.  You see red streaks coming from the affected area.  Your bone or joint underneath the affected area becomes painful after the skin has healed.  The affected area turns darker.  You have trouble breathing. This information is not intended to replace advice given to you by your health care provider. Make sure you discuss any questions you have with your health care provider. Document Released: 01/04/2009 Document Revised: 08/15/2015 Document Reviewed: 07/25/2014  2017 Elsevier

## 2016-05-18 NOTE — Progress Notes (Signed)
Patient ID: Rhonda Cruz, female    DOB: Dec 16, 1965, 51 y.o.   MRN: [id number]  PCP: Harrison Mons, PA-C  Chief Complaint  Patient presents with  . Sore Throat    feeling better  . Rash    leg  . Medication Refill    prozac    Subjective:   Presents for evaluation of sore throat, rash and for refill of fluoxetine.  She developed cough and sore throat 2 weeks ago, as part of a URI-type illness. She is much better, reporting continued improvement of symptoms and no longer needing OTC Mucinex she had started to address the cough.  Also about 2 weeks ago she developed a rash on the leg. The area seems to be growing in size and the lesions are now itchy. No pain. Topical anti-itch product is moderately helpful.  No new products or potential exposures identified.  In addition, she will be traveling to [place] next week, and wonders if the doxycycline she has in preparation for a trip in the fall of 2017 but didn't go on, remains effective. She also as a supply of Malarone. She isn't sure which to use for malaria prophylaxis.   Review of Systems  Constitutional: Negative for chills, diaphoresis, fatigue and fever.  HENT: Positive for congestion and sore throat. Negative for dental problem, drooling, ear discharge, ear pain, facial swelling, hearing loss, mouth sores, nosebleeds, postnasal drip, rhinorrhea, sinus pain, sinus pressure, sneezing, trouble swallowing and voice change.   Eyes: Negative for pain, discharge, redness and itching.  Respiratory: Positive for cough. Negative for choking, chest tightness, shortness of breath and wheezing.   Cardiovascular: Negative.   Gastrointestinal: Negative for abdominal pain, constipation, diarrhea, nausea and vomiting.  Genitourinary: Negative for dysuria, frequency and urgency.  Musculoskeletal: Negative for arthralgias, gait problem, joint swelling and myalgias.  Skin: Positive for rash.  Allergic/Immunologic: Negative for  environmental allergies, food allergies and immunocompromised state.  Neurological: Negative for dizziness, weakness, light-headedness and headaches.  Hematological: Negative for adenopathy. Does not bruise/bleed easily.  Psychiatric/Behavioral: Negative for agitation, confusion, decreased concentration, dysphoric mood, self-injury and suicidal ideas. The patient is not nervous/anxious.        Patient Active Problem List   Diagnosis Date Noted  . Immune to hepatitis B 11/15/2015  . Reactive depression 07/24/2015  . Insomnia 06/29/2015  . Exercise-induced asthma 06/29/2015  . Mass of breast, right upper outer quadrant 10/28/2010     Prior to Admission medications   Medication Sig Start Date End Date Taking? Authorizing Provider  albuterol (PROVENTIL HFA;VENTOLIN HFA) 108 (90 Base) MCG/ACT inhaler Inhale 2 puffs into the lungs every 6 (six) hours as needed for wheezing. 09/23/15 07/10/18 Yes Leandrew Koyanagi, MD  b complex vitamins tablet Take 1 tablet by mouth daily.   Yes Historical Provider, MD  fish oil-omega-3 fatty acids 1000 MG capsule Take 2 g by mouth daily.     Yes Historical Provider, MD  FLUoxetine (PROZAC) 10 MG tablet Take 2 tablets (20 mg total) by mouth daily. 09/23/15  Yes Leandrew Koyanagi, MD  Fluticasone-Salmeterol (ADVAIR DISKUS) 250-50 MCG/DOSE AEPB Inhale 1 puff into the lungs every 12 (twelve) hours. 09/23/15  Yes Leandrew Koyanagi, MD  ondansetron (ZOFRAN ODT) 4 MG disintegrating tablet Dissolve one pill in mouth every 4-6 hours as needed for nausea and vomiting 08/29/11  Yes Posey Boyer, MD  Maca Root 500 MG CAPS Take 1 capsule by mouth 2 (two) times daily. Reported on 07/24/2015  Historical Provider, MD  tranexamic acid (LYSTEDA) 650 MG TABS Take 2 tablets (1,300 mg total) by mouth 3 (three) times daily. 09/14/11   Earnstine Regal, PA-C  triazolam (HALCION) 0.25 MG tablet One when wakes early Patient not taking: Reported on 05/18/2016 06/29/15   Leandrew Koyanagi, MD    typhoid (VIVOTIF) DR capsule Take 1 capsule by mouth every other day. X 4 doses. Patient not taking: Reported on 05/18/2016 11/14/15   Harrison Mons, PA-C     No Known Allergies     Objective:  Physical Exam  Constitutional: She is oriented to person, place, and time. She appears well-developed and well-nourished. She is active and cooperative. No distress.  BP 120/70 (BP Location: Right Arm, Patient Position: Sitting, Cuff Size: Small)   Pulse 76   Temp 98.3 F (36.8 C) (Oral)   Resp 18   Ht 5\' 2"  (1.575 m)   Wt 134 lb (60.8 kg)   LMP 11/14/2014   SpO2 96%   BMI 24.51 kg/m   HENT:  Head: Normocephalic and atraumatic.  Right Ear: Hearing normal.  Left Ear: Hearing normal.  Eyes: Conjunctivae are normal. No scleral icterus.  Neck: Normal range of motion. Neck supple. No thyromegaly present.  Cardiovascular: Normal rate, regular rhythm and normal heart sounds.   Pulses:      Radial pulses are 2+ on the right side, and 2+ on the left side.  Pulmonary/Chest: Effort normal and breath sounds normal.  Lymphadenopathy:       Head (right side): No tonsillar, no preauricular, no posterior auricular and no occipital adenopathy present.       Head (left side): No tonsillar, no preauricular, no posterior auricular and no occipital adenopathy present.    She has no cervical adenopathy.       Right: No supraclavicular adenopathy present.       Left: No supraclavicular adenopathy present.  Neurological: She is alert and oriented to person, place, and time. No sensory deficit.  Skin: Skin is warm, dry and intact. Rash noted. Rash is maculopapular (Upper LEFT thigh. Non-specific.). No cyanosis or erythema. Nails show no clubbing.  Psychiatric: She has a normal mood and affect. Her speech is normal and behavior is normal.           Assessment & Plan:   1. Dermatitis Good skin hygiene. OTC topical steroid cream, oral antihistamine. Re-evaluate if worsens.  2. Sore throat Improving.  Anticipate complete resolution. Hydrate. PRN NSAIDS.  3. Reactive depression Stable. Controlled. Continue current treatment. - FLUoxetine (PROZAC) 10 MG tablet; Take 2 tablets (20 mg total) by mouth daily.  Dispense: 180 tablet; Refill: 3  4. Motion sickness, initial encounter Has used Zofran in the past with good relief. - ondansetron (ZOFRAN ODT) 4 MG disintegrating tablet; Dissolve one pill in mouth every 4-6 hours as needed for nausea and vomiting  Dispense: 10 tablet; Refill: 0  5. Screening for colon cancer - Ambulatory referral to Gastroenterology   Fara Chute, PA-C Physician Assistant-Certified [facility] [place]

## 2016-05-18 NOTE — Progress Notes (Signed)
Patient ID: Rhonda Cruz, female     DOB: 08-Feb-1966, 51 y.o.    MRN: [id number]  PCP: Harrison Mons, PA-C  Chief Complaint  Patient presents with  . Sore Throat    feeling better  . Rash    leg  . Medication Refill    prozac    Subjective:  Presents for evaluation of sore throat, cough and rash leg as well as a medication refill. Her cough and sore throat began two weeks ago which was have all but subsided. She took mucinex for the cough which she has not needed for the last several days. She denies fever, malaise, joint pain or chills.   She is traveling to [place] in a week, and wanted to know if her Doxycycline she received last year (fall of 2017) was ok to take or should she try Colome. She has been given a bottle of Malarone from a friend which she has currently.     Leg rash began around 2x weeks ago and has since grown in size and became pruritic. She denies pain but says it itches "pretty bad" and has tried topical anti itch medication which does help.   Pt also states she needs a Prozac refill before she goes to [place] to cover her for the trip. She denies any adverse events or side effects and states her reactive depression is controled.   Review of Systems  Constitutional: Negative.   HENT: Positive for sore throat. Rhinorrhea: resolved.   Eyes: Negative.   Respiratory: Positive for cough.   Cardiovascular: Negative.   Gastrointestinal: Negative.   Endocrine: Negative.   Genitourinary: Negative.   Musculoskeletal: Negative.   Skin: Negative.   Allergic/Immunologic: Negative.   Neurological: Negative.   Hematological: Negative.   Psychiatric/Behavioral: Negative.      Prior to Admission medications   Medication Sig Start Date End Date Taking? Authorizing Provider  albuterol (PROVENTIL HFA;VENTOLIN HFA) 108 (90 Base) MCG/ACT inhaler Inhale 2 puffs into the lungs every 6 (six) hours as needed for wheezing. 09/23/15 07/10/18 Yes Leandrew Koyanagi, MD  b  complex vitamins tablet Take 1 tablet by mouth daily.   Yes Historical Provider, MD  fish oil-omega-3 fatty acids 1000 MG capsule Take 2 g by mouth daily.     Yes Historical Provider, MD  FLUoxetine (PROZAC) 10 MG tablet Take 2 tablets (20 mg total) by mouth daily. 09/23/15  Yes Leandrew Koyanagi, MD  Fluticasone-Salmeterol (ADVAIR DISKUS) 250-50 MCG/DOSE AEPB Inhale 1 puff into the lungs every 12 (twelve) hours. 09/23/15  Yes Leandrew Koyanagi, MD  ondansetron (ZOFRAN ODT) 4 MG disintegrating tablet Dissolve one pill in mouth every 4-6 hours as needed for nausea and vomiting 08/29/11  Yes Posey Boyer, MD  Maca Root 500 MG CAPS Take 1 capsule by mouth 2 (two) times daily. Reported on 07/24/2015    Historical Provider, MD  tranexamic acid (LYSTEDA) 650 MG TABS Take 2 tablets (1,300 mg total) by mouth 3 (three) times daily. 09/14/11   Earnstine Regal, PA-C  triazolam (HALCION) 0.25 MG tablet One when wakes early Patient not taking: Reported on 05/18/2016 06/29/15   Leandrew Koyanagi, MD  typhoid (VIVOTIF) DR capsule Take 1 capsule by mouth every other day. X 4 doses. Patient not taking: Reported on 05/18/2016 11/14/15   Harrison Mons, PA-C     No Known Allergies   Patient Active Problem List   Diagnosis Date Noted  . Immune to hepatitis B 11/15/2015  .  Reactive depression 07/24/2015  . Insomnia 06/29/2015  . Exercise-induced asthma 06/29/2015  . Mass of breast, right upper outer quadrant 10/28/2010     Family History  Problem Relation Age of Onset  . Cancer Mother 87    breast, s/p lumpectomy  . Asthma Mother   . Hypertension Father   . Cancer Father     skin  . Skin cancer Father   . Cancer Brother     signs of skin cancer   . Hypertension Brother   . Depression Brother      Social History   Social History  . Marital status: Divorced    Spouse name: n/a  . Number of children: 0  . Years of education: Master's +   Occupational History  . 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 Smoking status: Never Smoker  . Smokeless tobacco: Never Used  . Alcohol use 0.0 oz/week    3 - 1 Glasses of wine per week     Comment: 1-3 drinks/ 2-3 days a week  . Drug use: No  . Sexual activity: Not Currently    Partners: Male    Birth control/ protection: Abstinence   Other Topics Concern  . Not on file   Social History Narrative   3 Master's Degrees: MFA Interdisciplinary Arts, Med Theater Ed, Edm Arboriculturist and Psychology   Lives alone.   Family lives locally      Objective:  Physical Exam  Constitutional: She is oriented to person, place, and time. Vital signs are normal. She appears well-developed and well-nourished. She is active.  Blood pressure 120/70, pulse 76, temperature 98.3 F (36.8 C), temperature source Oral, resp. rate 18, height 5\' 2"  (1.575 m), weight 134 lb (60.8 kg), last menstrual period 11/14/2014, SpO2 96 %.  Eyes: Pupils are equal, round, and reactive to light.  Neck: Normal range of motion. Neck supple.  Cardiovascular: Normal rate, regular rhythm and normal heart sounds.   Pulmonary/Chest: Effort normal and breath sounds normal.  Neurological: She is alert and oriented to person, place, and time.  Skin: Skin is warm and dry. Rash noted.  Rash was evaluated by Harrison Mons, PA-C. I did not evaluate the rash.   Psychiatric: She has a normal mood and affect. Her behavior is normal. Judgment and thought content normal.      Assessment & Plan:  1. Dermatitis She was told to apply an OTC hydrocortisone cream if symptoms persisted and if it worsened to return for evaluation after her trip.   2. Sore throat Ibuprofen or Acetaminophen for pain as needed.   3. Reactive depression Refill for: - FLUoxetine (PROZAC) 10 MG tablet; Take 2 tablets (20 mg total) by mouth daily.  Dispense: 180 tablet; Refill: 3  4. Motion sickness, initial encounter Given a Rx for Zofran to prevent GI issues when  travelling to and around [place] - ondansetron (ZOFRAN ODT) 4 MG disintegrating tablet; Dissolve one pill in mouth every 4-6 hours as needed for nausea and vomiting  Dispense: 10 tablet; Refill: 0  5. Screening for colon cancer She states she is aware of the need to have the colonoscopy and will await the GI office's call for scheduling. - Ambulatory referral to Gastroenterology

## 2016-05-19 DIAGNOSIS — Z6824 Body mass index (BMI) 24.0-24.9, adult: Secondary | ICD-10-CM | POA: Diagnosis not present

## 2016-05-19 DIAGNOSIS — Z01419 Encounter for gynecological examination (general) (routine) without abnormal findings: Secondary | ICD-10-CM | POA: Diagnosis not present

## 2016-05-19 DIAGNOSIS — Z124 Encounter for screening for malignant neoplasm of cervix: Secondary | ICD-10-CM | POA: Diagnosis not present

## 2016-06-30 ENCOUNTER — Encounter: Payer: Self-pay | Admitting: Physician Assistant

## 2016-07-26 DIAGNOSIS — L03011 Cellulitis of right finger: Secondary | ICD-10-CM | POA: Diagnosis not present

## 2016-10-20 ENCOUNTER — Other Ambulatory Visit: Payer: Self-pay | Admitting: Emergency Medicine

## 2016-10-20 ENCOUNTER — Other Ambulatory Visit: Payer: Self-pay

## 2016-10-20 DIAGNOSIS — J45990 Exercise induced bronchospasm: Secondary | ICD-10-CM

## 2016-10-20 MED ORDER — FLUTICASONE-SALMETEROL 250-50 MCG/DOSE IN AEPB: 1.0000 | INHALATION_SPRAY | Freq: Two times a day (BID) | RESPIRATORY_TRACT | 1 refills | Status: DC

## 2017-01-19 ENCOUNTER — Encounter: Payer: 59 | Admitting: Physician Assistant

## 2017-01-28 ENCOUNTER — Encounter: Payer: Self-pay | Admitting: Physician Assistant

## 2017-01-28 ENCOUNTER — Ambulatory Visit (INDEPENDENT_AMBULATORY_CARE_PROVIDER_SITE_OTHER): Payer: 59 | Admitting: Physician Assistant

## 2017-01-28 VITALS — BP 124/78 | HR 88 | Temp 98.5°F | Resp 18 | Ht 62.0 in | Wt 135.4 lb

## 2017-01-28 DIAGNOSIS — R1031 Right lower quadrant pain: Secondary | ICD-10-CM | POA: Diagnosis not present

## 2017-01-28 DIAGNOSIS — Z23 Encounter for immunization: Secondary | ICD-10-CM | POA: Diagnosis not present

## 2017-01-28 DIAGNOSIS — J45990 Exercise induced bronchospasm: Secondary | ICD-10-CM

## 2017-01-28 DIAGNOSIS — Z0000 Encounter for general adult medical examination without abnormal findings: Secondary | ICD-10-CM | POA: Diagnosis not present

## 2017-01-28 DIAGNOSIS — M545 Low back pain, unspecified: Secondary | ICD-10-CM

## 2017-01-28 DIAGNOSIS — D259 Leiomyoma of uterus, unspecified: Secondary | ICD-10-CM | POA: Insufficient documentation

## 2017-01-28 DIAGNOSIS — Z1211 Encounter for screening for malignant neoplasm of colon: Secondary | ICD-10-CM

## 2017-01-28 DIAGNOSIS — F329 Major depressive disorder, single episode, unspecified: Secondary | ICD-10-CM | POA: Diagnosis not present

## 2017-01-28 DIAGNOSIS — Z1231 Encounter for screening mammogram for malignant neoplasm of breast: Secondary | ICD-10-CM

## 2017-01-28 DIAGNOSIS — N946 Dysmenorrhea, unspecified: Secondary | ICD-10-CM | POA: Insufficient documentation

## 2017-01-28 DIAGNOSIS — Z114 Encounter for screening for human immunodeficiency virus [HIV]: Secondary | ICD-10-CM

## 2017-01-28 DIAGNOSIS — M25561 Pain in right knee: Secondary | ICD-10-CM

## 2017-01-28 DIAGNOSIS — N898 Other specified noninflammatory disorders of vagina: Secondary | ICD-10-CM

## 2017-01-28 DIAGNOSIS — R202 Paresthesia of skin: Secondary | ICD-10-CM

## 2017-01-28 DIAGNOSIS — B009 Herpesviral infection, unspecified: Secondary | ICD-10-CM

## 2017-01-28 MED ORDER — FLUOXETINE HCL 10 MG PO TABS: 20.0000 mg | ORAL_TABLET | Freq: Every day | ORAL | 3 refills | Status: DC

## 2017-01-28 MED ORDER — ALBUTEROL SULFATE HFA 108 (90 BASE) MCG/ACT IN AERS: 2.0000 | INHALATION_SPRAY | Freq: Four times a day (QID) | RESPIRATORY_TRACT | 5 refills | Status: DC | PRN

## 2017-01-28 MED ORDER — VALACYCLOVIR HCL 500 MG PO TABS: 500.0000 mg | ORAL_TABLET | Freq: Two times a day (BID) | ORAL | 99 refills | Status: DC

## 2017-01-28 MED ORDER — FLUTICASONE-SALMETEROL 250-50 MCG/DOSE IN AEPB: 1.0000 | INHALATION_SPRAY | Freq: Two times a day (BID) | RESPIRATORY_TRACT | 1 refills | Status: DC

## 2017-01-28 NOTE — Progress Notes (Signed)
Patient ID: Rhonda Cruz, female    DOB: 18-Feb-1966, 51 y.o.   MRN: [id number]  PCP: Harrison Mons, PA-C  Chief Complaint  Patient presents with  . Annual Exam    With PAP    Subjective:   Presents for [employer] [employer] Visit.  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 No history of abnormal pap test. Next due 2019. Breast Cancer Screening: CBE annually, mammogram 06/2014, normal. Colorectal Cancer Screening: due for screening. Previous colonoscopy 1992 for evaluation of rectal bleeding. Bone Density Testing: not yet a candidate HIV Screening: none documented STI Screening: very low risk Seasonal Influenza Vaccination: today Td/Tdap Vaccination: 02/25/2015 Pneumococcal Vaccination: not yet a candidate Zoster Vaccination: not yet Frequency of Dental evaluation: Q6 months with Dr. Conley Simmonds Frequency of Eye evaluation: >12 months, Dr. Heather [place]    Patient Active Problem List   Diagnosis Date Noted  . Dysmenorrhea 01/28/2017  . Uterine leiomyoma 01/28/2017  . Immune to hepatitis B 11/15/2015  . Reactive depression 07/24/2015  . Insomnia 06/29/2015  . Exercise-induced asthma 06/29/2015  . Mass of breast, right upper outer quadrant 10/28/2010    Past Medical History:  Diagnosis Date  . Anemia   . Asthma   . Depression   . Fatigue   . Fibroids   . HPV (human papilloma virus) infection   . HSV infection      Prior to Admission medications   Medication Sig Start Date End Date Taking? Authorizing Provider  albuterol (PROVENTIL HFA;VENTOLIN HFA) 108 (90 Base) MCG/ACT inhaler Inhale 2 puffs into the lungs every 6 (six) hours as needed for wheezing. 09/23/15 07/10/18 Yes Leandrew Koyanagi, MD  b complex vitamins tablet Take 1 tablet by mouth daily.   Yes [provider]  fish oil-omega-3 fatty acids 1000 MG capsule Take 2 g by mouth daily.     Yes [provider]  FLUoxetine (PROZAC) 10 MG tablet Take 2 tablets (20 mg total) by mouth daily. 05/18/16  Yes Huel Centola, PA-C  Fluticasone-Salmeterol (ADVAIR DISKUS) 250-50 MCG/DOSE AEPB Inhale 1 puff into the lungs every 12 (twelve) hours. 10/20/16  Yes Harrison Mons, PA-C    No Known Allergies  Past Surgical History:  Procedure Laterality Date  . CERVIX LESION DESTRUCTION    . COLONOSCOPY  1991  . CYSTECTOMY     knee  . TMJ ARTHROPLASTY Bilateral 1988  . TONSILLECTOMY AND ADENOIDECTOMY      Family History  Problem Relation Age of Onset  . Cancer Mother 8       breast, s/p lumpectomy  . Asthma Mother   . Hypertension Father   . Cancer Father        skin  . Skin cancer Father   . Cancer Brother        signs of skin cancer   . Hypertension Brother   . Depression Brother     Social History   Socioeconomic History  . Marital status: Divorced    Spouse name: n/a  . Number of children: 0  . Years of education: None  . Highest education level: Master's degree (e.g., MA, MS, MEng, MEd, MSW, MBA)  Social Needs  . Financial resource strain: None  . Food insecurity - worry: None  . Food insecurity - inability: None  . Transportation needs - medical: None  . Transportation needs - non-medical: None  Occupational History  . 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 Smoking status: Never Smoker  . Smokeless tobacco: Never Used  Substance and Sexual Activity  . Alcohol use: Yes    Alcohol/week: 0.0 oz    Types: 3 - 1 Glasses of wine per week    Comment: 1-3 drinks/ 2-3 days a week  . Drug use: No  . Sexual activity: Not Currently    Partners: Male    Birth control/protection: Abstinence  Other Topics Concern  . None  Social History Narrative   3 Master's Degrees: Scientist, research (life sciences), Med Theater Ed, Edm Arboriculturist and Psychology   Lives alone in a condo. She has someone who stays over frequently, but does not live with  her.   She has a dog and cat.   Family lives locally        Review of Systems  Constitutional: Negative.   HENT: Negative.   Eyes: Negative.   Respiratory: Negative.   Cardiovascular: Negative.   Gastrointestinal: Negative.   Endocrine: Negative.   Genitourinary: Positive for vaginal discharge (malodorous, but otherwise normal for her). Negative for decreased urine volume, difficulty urinating, dyspareunia, dysuria, enuresis, flank pain, frequency, genital sores, hematuria, menstrual problem, pelvic pain, urgency, vaginal bleeding and vaginal pain.  Musculoskeletal: Positive for arthralgias (LEFT knee) and back pain (popping). Negative for gait problem, joint swelling, myalgias, neck pain and neck stiffness.  Skin: Negative.   Allergic/Immunologic: Negative.   Neurological: Positive for numbness (LEFT arm, intermittent). Negative for dizziness, tremors, seizures, syncope, facial asymmetry, speech difficulty, weakness, light-headedness and headaches.  Hematological: Negative.   Psychiatric/Behavioral: Negative.     Depression screen [facility] 2/9 01/28/2017 01/28/2017 01/28/2017 01/28/2017 05/18/2016  Decreased Interest 0 0 0 0 0  Down, Depressed, Hopeless 0 0 0 0 0  PHQ - 2 Score 0 0 0 0 0  Altered sleeping 0 - - - -  Tired, decreased energy 0 - - - -  Change in appetite 0 - - - -  Feeling bad or failure about yourself  0 - - - -  Trouble concentrating 0 - - - -  Moving slowly or fidgety/restless 0 - - - -  Suicidal thoughts 0 - - - -  PHQ-9 Score 0 - - - -       Objective:  Physical Exam  Constitutional: She is oriented to person, place, and time. Vital signs are normal. She appears well-developed and well-nourished. She is active and cooperative. No distress.  BP 124/78 (BP Location: Left Arm, Patient Position: Sitting, Cuff Size: Normal)   Pulse 88   Temp 98.5 F (36.9 C) (Oral)   Resp 18   Ht 5\' 2"  (1.575 m)   Wt 135 lb 6.4 oz (61.4 kg)   LMP 11/14/2014   SpO2 97%   BMI  24.76 kg/m    HENT:  Head: Normocephalic and atraumatic.  Right Ear: Hearing, tympanic membrane, external ear and ear canal normal. No foreign bodies.  Left Ear: Hearing, tympanic membrane, external ear and ear canal normal. No foreign bodies.  Nose: Nose normal.  Mouth/Throat: Uvula is midline, oropharynx is clear and moist and mucous membranes are normal. No oral lesions. Normal dentition. No dental abscesses or uvula swelling. No oropharyngeal exudate.  Eyes: Conjunctivae, EOM and lids are normal. Pupils are equal, round, and reactive to light. Right eye exhibits no discharge. Left eye exhibits no discharge. No scleral icterus.  Fundoscopic exam:      The right eye shows no arteriolar narrowing, no  AV nicking, no exudate, no hemorrhage and no papilledema.       The left eye shows no arteriolar narrowing, no AV nicking, no exudate, no hemorrhage and no papilledema.  Neck: Trachea normal, normal range of motion and full passive range of motion without pain. Neck supple. No spinous process tenderness and no muscular tenderness present. No thyroid mass and no thyromegaly present.  Cardiovascular: Normal rate, regular rhythm, normal heart sounds, intact distal pulses and normal pulses.  Pulmonary/Chest: Effort normal and breath sounds normal. She exhibits no tenderness and no retraction. Right breast exhibits no inverted nipple, no mass, no nipple discharge, no skin change and no tenderness. Left breast exhibits no inverted nipple, no mass, no nipple discharge, no skin change and no tenderness. Breasts are symmetrical.  Abdominal: Soft. Normal appearance and bowel sounds are normal. She exhibits no distension and no mass. There is no hepatosplenomegaly. There is tenderness (mild, RLQ, suprapubic). There is no rigidity, no rebound, no guarding, no CVA tenderness, no tenderness at McBurney's point and negative Murphy's sign. No hernia. Hernia confirmed negative in the right inguinal area and confirmed  negative in the left inguinal area.  Genitourinary: No breast swelling, tenderness, discharge or bleeding. Pelvic exam was performed with patient supine. No labial fusion. There is no rash, tenderness, lesion or injury on the right labia. There is no rash, tenderness, lesion or injury on the left labia.  Musculoskeletal: She exhibits no edema or tenderness.       Cervical back: Normal.       Thoracic back: Normal.       Lumbar back: Normal.  Lymphadenopathy:       Head (right side): No tonsillar, no preauricular, no posterior auricular and no occipital adenopathy present.       Head (left side): No tonsillar, no preauricular, no posterior auricular and no occipital adenopathy present.    She has no cervical adenopathy.    She has no axillary adenopathy.       Right: No inguinal and no supraclavicular adenopathy present.       Left: No inguinal and no supraclavicular adenopathy present.  Neurological: She is alert and oriented to person, place, and time. She has normal strength and normal reflexes. No cranial nerve deficit. She exhibits normal muscle tone. Coordination and gait normal.  Skin: Skin is warm, dry and intact. No rash noted. She is not diaphoretic. No cyanosis or erythema. Nails show no clubbing.  Psychiatric: She has a normal mood and affect. Her speech is normal and behavior is normal. Judgment and thought content normal.    Wt Readings from Last 3 Encounters:  01/28/17 135 lb 6.4 oz (61.4 kg)  05/18/16 134 lb (60.8 kg)  11/14/15 132 lb 3.2 oz (60 kg)       Assessment & Plan:   Problem List Items Addressed This Visit    Exercise-induced asthma    Stable. Controlled. No changes.      Relevant Medications   Fluticasone-Salmeterol (ADVAIR DISKUS) 250-50 MCG/DOSE AEPB   albuterol (PROVENTIL HFA;VENTOLIN HFA) 108 (90 Base) MCG/ACT inhaler   Reactive depression    Stable. Controlled. She would like to stop fluoxetine. Counseled on timing (spring/summer) and long taper.       Relevant Medications   FLUoxetine (PROZAC) 10 MG tablet   HSV-2 infection    Continue episodic treatment as needed.      Relevant Medications   valACYclovir (VALTREX) 500 MG tablet    Other Visit Diagnoses  Annual physical exam    -  Primary   Age appropriate health guidance provided   Flu vaccine need       Relevant Orders   Flu Vaccine QUAD 36+ mos IM (Completed)   Screening for HIV (human immunodeficiency virus)       Relevant Orders   HIV antibody   Screening for colon cancer       Relevant Orders   Ambulatory referral to Gastroenterology   Encounter for screening mammogram for breast cancer       Relevant Orders   MM DIGITAL SCREENING BILATERAL   Paresthesia of left arm       Not bothering her enough to want additional evaluation. She'll let me know if her symptoms worsen/persist. Would consider c-spine films.   Acute pain of right knee       Not bothering her enough to want additional evaluation. She'll let me know if her symptoms worsen/persist. Would consider knee films.   Acute bilateral low back pain without sciatica       Not bothering her enough to want additional evaluation. She'll let me know if her symptoms worsen/persist. Would consider L-spine films.   Vaginal discharge       Await wet-prep.   Relevant Orders   WET PREP FOR [place], YEAST, CLUE   RLQ abdominal pain       Doubt appendicitis. Await labs. Contact me immediately if pain worsens, develops fever, nausea, etc. Otherwise consider additional evaluation if symptoms persis   Relevant Orders   CBC with Differential/Platelet   Comprehensive metabolic panel   Urinalysis, dipstick only   TSH       Return in about 1 year (around 01/28/2018) for wellness exam.   Fara Chute, PA-C [facility]

## 2017-01-28 NOTE — Assessment & Plan Note (Signed)
Continue episodic treatment as needed.

## 2017-01-28 NOTE — Assessment & Plan Note (Signed)
Stable. Controlled. No changes.

## 2017-01-28 NOTE — Patient Instructions (Addendum)
   IF you received an x-ray today, you will receive an invoice from [facility]. Please contact [facility] at [phone] with questions or concerns regarding your invoice.   IF you received labwork today, you will receive an invoice from [facility]. Please contact [facility] at [phone] with questions or concerns regarding your invoice.   Our billing staff will not be able to assist you with questions regarding bills from these companies.  You will be contacted with the lab results as soon as they are available. The fastest way to get your results is to activate your My Chart account. Instructions are located on the last page of this paperwork. If you have not heard from us regarding the results in 2 weeks, please contact this office.     Preventive Care 40-64 Years, Female Preventive care refers to lifestyle choices and visits with your health care provider that can promote health and wellness. What does preventive care include?  A yearly physical exam. This is also called an annual well check.  Dental exams once or twice a year.  Routine eye exams. Ask your health care provider how often you should have your eyes checked.  Personal lifestyle choices, including: ? Daily care of your teeth and gums. ? Regular physical activity. ? Eating a healthy diet. ? Avoiding tobacco and drug use. ? Limiting alcohol use. ? Practicing safe sex. ? Taking low-dose aspirin daily starting at age 50. ? Taking vitamin and mineral supplements as recommended by your health care provider. What happens during an annual well check? The services and screenings done by your health care provider during your annual well check will depend on your age, overall health, lifestyle risk factors, and family history of disease. Counseling Your health care provider may ask you questions about your:  Alcohol use.  Tobacco use.  Drug use.  Emotional well-being.  Home and relationship  well-being.  Sexual activity.  Eating habits.  Work and work environment.  Method of birth control.  Menstrual cycle.  Pregnancy history.  Screening You may have the following tests or measurements:  Height, weight, and BMI.  Blood pressure.  Lipid and cholesterol levels. These may be checked every 5 years, or more frequently if you are over 50 years old.  Skin check.  Lung cancer screening. You may have this screening every year starting at age 55 if you have a 30-pack-year history of smoking and currently smoke or have quit within the past 15 years.  Fecal occult blood test (FOBT) of the stool. You may have this test every year starting at age 50.  Flexible sigmoidoscopy or colonoscopy. You may have a sigmoidoscopy every 5 years or a colonoscopy every 10 years starting at age 50.  Hepatitis C blood test.  Hepatitis B blood test.  Sexually transmitted disease (STD) testing.  Diabetes screening. This is done by checking your blood sugar (glucose) after you have not eaten for a while (fasting). You may have this done every 1-3 years.  Mammogram. This may be done every 1-2 years. Talk to your health care provider about when you should start having regular mammograms. This may depend on whether you have a family history of breast cancer.  BRCA-related cancer screening. This may be done if you have a family history of breast, ovarian, tubal, or peritoneal cancers.  Pelvic exam and Pap test. This may be done every 3 years starting at age 21. Starting at age 30, this may be done every 5 years if you   have a Pap test in combination with an HPV test.  Bone density scan. This is done to screen for osteoporosis. You may have this scan if you are at high risk for osteoporosis.  Discuss your test results, treatment options, and if necessary, the need for more tests with your health care provider. Vaccines Your health care provider may recommend certain vaccines, such  as:  Influenza vaccine. This is recommended every year.  Tetanus, diphtheria, and acellular pertussis (Tdap, Td) vaccine. You may need a Td booster every 10 years.  Varicella vaccine. You may need this if you have not been vaccinated.  Zoster vaccine. You may need this after age 60.  Measles, mumps, and rubella (MMR) vaccine. You may need at least one dose of MMR if you were born in 1957 or later. You may also need a second dose.  Pneumococcal 13-valent conjugate (PCV13) vaccine. You may need this if you have certain conditions and were not previously vaccinated.  Pneumococcal polysaccharide (PPSV23) vaccine. You may need one or two doses if you smoke cigarettes or if you have certain conditions.  Meningococcal vaccine. You may need this if you have certain conditions.  Hepatitis A vaccine. You may need this if you have certain conditions or if you travel or work in places where you may be exposed to hepatitis A.  Hepatitis B vaccine. You may need this if you have certain conditions or if you travel or work in places where you may be exposed to hepatitis B.  Haemophilus influenzae type b (Hib) vaccine. You may need this if you have certain conditions.  Talk to your health care provider about which screenings and vaccines you need and how often you need them. This information is not intended to replace advice given to you by your health care provider. Make sure you discuss any questions you have with your health care provider. Document Released: 04/05/2015 Document Revised: 11/27/2015 Document Reviewed: 01/08/2015 Elsevier Interactive Patient Education  2017 [employer]  

## 2017-01-28 NOTE — Assessment & Plan Note (Signed)
Stable. Controlled. She would like to stop fluoxetine. Counseled on timing (spring/summer) and long taper.

## 2017-01-28 NOTE — Progress Notes (Signed)
Subjective:    Patient ID: Rhonda Cruz, female    DOB: Jan 12, 1966, 51 y.o.   MRN: [id number]  HPI  Rhonda Cruz is a 51 year old Caucasian female with a past medical history significant for insomnia, depression, and exercise-induced asthma who presents today for annual wellness exam. Rhonda Cruz reports overall she thinks everything is going ok. She states over the past couple of months she has gained some weight which she believes is due to poor eating habits, less exercise, and less sleep. 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 She does not cook very often. Her lunches are typically healthy and she loves breakfast. She states her exercise varies, but she will run about 3-5 miles per week and tries to yoga weekly and walks her dog 2-4 times per day. She drinks 2-3 glasses of wine or beers 5 days per week socially. She denies use of tobacco products. She will smoke marijuana couple times a month.   Rhonda Cruz states her lower back seems to be popping or cracking a little more.  She reports it does not hurt, she just sometimes feels it and can also hear it. She states it has been going on for several months. Rhonda Cruz states it only occurs occassionally. She states certain yoga positions will impact. She states her right arm sometimes tingles 2-3 times a day. Rhonda Cruz reports it has been occurring for a couple of weeks. She denies any pain or known injuries.   Rhonda Cruz also states she has right lateral knee pain for several months. She had a cyst removed in 2005 and has She states it is an aching pain. Patient reports the pain is a 4/10. She states sometimes it will keep her up at night. She states the pain does not radiate anywhere. She denies leg weakness, numbness or tingling.   Patient also reports some malodorous discharge last week. She states it was a little bit more than normal, not too much. She denies dysuria, hematuria, or  dyspareunia. Rhonda Cruz is sexually active and has 1 sexual partner. She is not concerned for STIs today. Rhonda Cruz has not had a period in the past 2 years. She states she has not had any vaginal bleeding.   Rhonda Cruz states she is still taking Fluoxetine regularly. She states she also sees a therapist every now and then as needed. She is concerned about going off of the medication.   Rhonda Cruz states she has some right lower quadrant pain. She will have a bowel movement 2-3 times a day. She reports the consistency varies depends on what she eats. She denies nausea or vomiting. She also denies hematochezia.   Review of Systems All other review of systems negative   Colon Cancer Screening: Last colonoscopy was 26 years ago for rectal bleeding.  Breast Cancer Screening: Last mammogram 07/16/14  Cervical Cancer Screening: Last pap smear 02/21/15, no history of abnormal pap smears  HIV Screening: October 2015 for blood donation Dentist: Dr. Conley Simmonds. Last visit was yesterday. Optometrist: [facility]. She has not been there in over a year.  Medications:  Prior to Admission medications   Medication Sig Start Date End Date Taking? Authorizing Provider  albuterol (PROVENTIL HFA;VENTOLIN HFA) 108 (90 Base) MCG/ACT inhaler Inhale 2 puffs into the lungs every 6 (six) hours as needed for wheezing. 09/23/15 07/10/18 Yes Leandrew Koyanagi, MD  b complex vitamins tablet Take  1 tablet by mouth daily.   Yes [provider]  fish oil-omega-3 fatty acids 1000 MG capsule Take 2 g by mouth daily.     Yes [provider]  FLUoxetine (PROZAC) 10 MG tablet Take 2 tablets (20 mg total) by mouth daily. 05/18/16  Yes Jeffery, [name], PA-C  Fluticasone-Salmeterol (ADVAIR DISKUS) 250-50 MCG/DOSE AEPB Inhale 1 puff into the lungs every 12 (twelve) hours. 10/20/16  Yes Jeffery, [name], PA-C  VALACYCLOVIR HCL PO Take by mouth.   Yes [provider]  Maca Root 500 MG CAPS Take 1  capsule by mouth 2 (two) times daily. Reported on 07/24/2015    [provider]  ondansetron (ZOFRAN ODT) 4 MG disintegrating tablet Dissolve one pill in mouth every 4-6 hours as needed for nausea and vomiting Patient not taking: Reported on 01/28/2017 05/18/16   Harrison Mons, PA-C  tranexamic acid (LYSTEDA) 650 MG TABS Take 2 tablets (1,300 mg total) by mouth 3 (three) times daily. Patient not taking: Reported on 01/28/2017 09/14/11   Earnstine Regal, PA-C  triazolam (HALCION) 0.25 MG tablet One when wakes early Patient not taking: Reported on 05/18/2016 06/29/15   Leandrew Koyanagi, MD  typhoid (VIVOTIF) DR capsule Take 1 capsule by mouth every other day. X 4 doses. Patient not taking: Reported on 05/18/2016 11/14/15   Harrison Mons, PA-C   Allergies:  No Known Allergies  Chronic Medical Conditions:  Patient Active Problem List   Diagnosis Date Noted  . Dysmenorrhea 01/28/2017  . Uterine leiomyoma 01/28/2017  . Immune to hepatitis B 11/15/2015  . Reactive depression 07/24/2015  . Insomnia 06/29/2015  . Exercise-induced asthma 06/29/2015  . Mass of breast, right upper outer quadrant 10/28/2010   Family History:  Family History  Problem Relation Age of Onset  . Cancer Mother 56       breast, s/p lumpectomy  . Asthma Mother   . Hypertension Father   . Cancer Father        skin  . Skin cancer Father   . Cancer Brother        signs of skin cancer   . Hypertension Brother   . Depression Brother    Surgical History:  Past Surgical History:  Procedure Laterality Date  . CERVIX LESION DESTRUCTION    . COLONOSCOPY  1991  . CYSTECTOMY     knee  . TMJ ARTHROPLASTY Bilateral 1988  . TONSILLECTOMY AND ADENOIDECTOMY        Objective:   Physical Exam  Constitutional: She appears well-developed and well-nourished. She is active and cooperative. No distress.  BP 124/78 (BP Location: Left Arm, Patient Position: Sitting, Cuff Size: Normal)   Pulse 88   Temp 98.5 F (36.9 C)  (Oral)   Resp 18   Ht 5\' 2"  (1.575 m)   Wt 135 lb 6.4 oz (61.4 kg)   LMP 11/14/2014   SpO2 97%   BMI 24.76 kg/m    HENT:  Head: Normocephalic and atraumatic. Hair is normal.  Right Ear: Tympanic membrane, external ear and ear canal normal. Tympanic membrane is not erythematous and not bulging. No middle ear effusion.  Left Ear: Tympanic membrane, external ear and ear canal normal. Tympanic membrane is not erythematous and not bulging.  No middle ear effusion.  Nose: Nose normal. No mucosal edema or rhinorrhea.  Mouth/Throat: Uvula is midline, oropharynx is clear and moist and mucous membranes are normal. No oral lesions. Normal dentition. No oropharyngeal exudate or posterior oropharyngeal erythema.  Eyes: Conjunctivae and  EOM are normal. Pupils are equal, round, and reactive to light.  Neck: Normal range of motion. Neck supple. No thyromegaly present.  Cardiovascular: Normal rate, regular rhythm, S1 normal, S2 normal, normal heart sounds and intact distal pulses.  No murmur heard. Pulses:      Radial pulses are 2+ on the right side, and 2+ on the left side.       Dorsalis pedis pulses are 2+ on the right side, and 2+ on the left side.  Pulmonary/Chest: Effort normal and breath sounds normal. She has no wheezes.  Abdominal: Soft. Bowel sounds are normal. She exhibits no distension. There is tenderness in the right lower quadrant and suprapubic area. There is no rebound and no tenderness at McBurney's point.  Genitourinary: There is no rash, tenderness or lesion on the right labia. There is no rash, tenderness or lesion on the left labia.  Musculoskeletal: Normal range of motion.       Left shoulder: She exhibits normal range of motion, no tenderness, no bony tenderness, no swelling, no pain, normal pulse and normal strength.       Right knee: She exhibits normal range of motion, no swelling, no effusion and no erythema. No tenderness found.  Strength 5/5 in all extremities      Lymphadenopathy:    She has no cervical adenopathy.  Neurological: She is alert. She displays normal reflexes. No cranial nerve deficit or sensory deficit. She exhibits normal muscle tone.  Reflex Scores:      Tricep reflexes are 2+ on the right side and 2+ on the left side.      Bicep reflexes are 2+ on the right side and 2+ on the left side.      Brachioradialis reflexes are 2+ on the right side and 2+ on the left side.      Patellar reflexes are 2+ on the right side and 2+ on the left side.      Achilles reflexes are 2+ on the right side and 2+ on the left side. Skin: Skin is warm and dry.      Assessment & Plan:  1. Annual physical exam - Provided patient with preventive care information for 51 year old women - Return to clinic in 1 year for follow-up  2. Flu vaccine need - Flu Vaccine QUAD 36+ mos IM administered today in clinic  3. Screening for HIV (human immunodeficiency virus) - HIV antibody obtained today in clinic  4. Screening for colon cancer - Ambulatory referral to Gastroenterology placed today for colonoscopy  5. Encounter for screening mammogram for breast cancer - MM Digital Screening Bilateral, ordered today in clinic  6. Reactive depression - Continue Fluoxetine 20mg  daily  - Continue seeing therapist as needed - Discussed weaning patient off of Fluoxetine. Patient would prefer to not take any medication for her depression. Instructed patient to continue Fluoxetine throughout the fall and winter and we will discuss weaning her off of Fluoxetine in the spring.   7. Paresthesia of left arm - Informed patient there could be several causes of the parasthesia in her left arm including carpal tunnel and ulnar nerve entrapment  - Discussed the option of obtaining x-rays today in clinic with patient, and she would like to wait to see if symptoms resolve - Begin Ibuprofen as needed for pain  - Instructed patient to apply ice to help with possible inflammation.  -  Patient to return to clinic if symptoms worsen or do not improve  8. Acute pain  of right knee - Discussed the option of obtaining x-rays today in clinic with patient, and she would like to wait to see if symptoms resolve - Begin Ibuprofen as needed for pain  - Instructed patient to apply ice to help relieve pain - Patient to return to clinic if symptoms worsen or do not improve  9. Acute bilateral low back pain without sciatica - Discussed the option of obtaining x-rays today in clinic with patient, and she would like to wait to see if symptoms resolve - Begin Ibuprofen as needed for back pain  - Instructed patient to apply ice or heat to help relieve pain - Patient to return to clinic if symptoms worsen or do not improve  10. Vaginal discharge - Wet Prep for yeast and clue obtained today in clinic   11. RLQ abdominal pain - CBC with Differential/Platelet obtained today in clinic - Comprehensive metabolic panel obtained today in clinic - Urinalysis, dipstick only obtained today in clinic - TSH obtained today in clinic - Instructed patient to return to clinic if she develops a fever, pain with walking or jumping, and worsening right lower quadrant pain.   12. Exercise-induced asthma - Continue Advair 250-3mcg/dose 1 puffs 2 times per day  - Continue Albuterol 2 puffs every 6 hours as needed for wheezing  - Patient to return to clinic if needing Albuterol more frequently than every 6 hours.  13. HSV-2 infection - Continue Valtrex 500mg  BID x 5 days as needed for outbreaks. - Patient to return to clinic if symptoms are not controlled with Valtrex.  Thalia Party, PA-S

## 2017-01-29 LAB — CBC WITH DIFFERENTIAL/PLATELET
BASOS: 1 %
Basophils Absolute: 0.1 10*3/uL (ref 0.0–0.2)
EOS (ABSOLUTE): 0.3 10*3/uL (ref 0.0–0.4)
EOS: 6 %
Hematocrit: 42.5 % (ref 34.0–46.6)
Hemoglobin: 14.5 g/dL (ref 11.1–15.9)
IMMATURE GRANS (ABS): 0 10*3/uL (ref 0.0–0.1)
IMMATURE GRANULOCYTES: 0 %
LYMPHS: 29 %
Lymphocytes Absolute: 1.3 10*3/uL (ref 0.7–3.1)
MCH: 30.3 pg (ref 26.6–33.0)
MCHC: 34.1 g/dL (ref 31.5–35.7)
MCV: 89 fL (ref 79–97)
Monocytes Absolute: 0.4 10*3/uL (ref 0.1–0.9)
Monocytes: 8 %
NEUTROS PCT: 56 %
Neutrophils Absolute: 2.4 10*3/uL (ref 1.4–7.0)
PLATELETS: 371 10*3/uL (ref 150–379)
RBC: 4.78 x10E6/uL (ref 3.77–5.28)
RDW: 12.9 % (ref 12.3–15.4)
WBC: 4.4 10*3/uL (ref 3.4–10.8)

## 2017-01-29 LAB — COMPREHENSIVE METABOLIC PANEL
A/G RATIO: 1.8 (ref 1.2–2.2)
ALT: 14 IU/L (ref 0–32)
AST: 20 IU/L (ref 0–40)
Albumin: 4.6 g/dL (ref 3.5–5.5)
Alkaline Phosphatase: 46 IU/L (ref 39–117)
BUN/Creatinine Ratio: 15 (ref 9–23)
BUN: 10 mg/dL (ref 6–24)
Bilirubin Total: 0.7 mg/dL (ref 0.0–1.2)
CALCIUM: 9.5 mg/dL (ref 8.7–10.2)
CO2: 26 mmol/L (ref 20–29)
CREATININE: 0.68 mg/dL (ref 0.57–1.00)
Chloride: 100 mmol/L (ref 96–106)
GFR, EST AFRICAN AMERICAN: 118 mL/min/{1.73_m2} (ref >59)
GFR, EST NON AFRICAN AMERICAN: 102 mL/min/{1.73_m2} (ref >59)
Globulin, Total: 2.6 g/dL (ref 1.5–4.5)
Glucose: 99 mg/dL (ref 65–99)
Potassium: 4.7 mmol/L (ref 3.5–5.2)
Sodium: 140 mmol/L (ref 134–144)
TOTAL PROTEIN: 7.2 g/dL (ref 6.0–8.5)

## 2017-01-29 LAB — URINALYSIS, DIPSTICK ONLY
Bilirubin, UA: NEGATIVE
Glucose, UA: NEGATIVE
Leukocytes, UA: NEGATIVE
NITRITE UA: NEGATIVE
PH UA: 6 (ref 5.0–7.5)
RBC, UA: NEGATIVE
Specific Gravity, UA: 1.022 (ref 1.005–1.030)
UUROB: 0.2 mg/dL (ref 0.2–1.0)

## 2017-01-29 LAB — TSH: TSH: 1.51 u[IU]/mL (ref 0.450–4.500)

## 2017-01-29 LAB — HIV ANTIBODY (ROUTINE TESTING W REFLEX): HIV Screen 4th Generation wRfx: NONREACTIVE

## 2017-01-30 LAB — WET PREP FOR TRICH, YEAST, CLUE
Clue Cell Exam: POSITIVE — AB
TRICHOMONAS EXAM: NEGATIVE
YEAST EXAM: NEGATIVE

## 2017-02-14 ENCOUNTER — Encounter: Payer: Self-pay | Admitting: Physician Assistant

## 2017-03-01 ENCOUNTER — Other Ambulatory Visit: Payer: Self-pay | Admitting: Physician Assistant

## 2017-03-01 MED ORDER — METRONIDAZOLE 500 MG PO TABS: 500.0000 mg | ORAL_TABLET | Freq: Two times a day (BID) | ORAL | 0 refills | Status: DC

## 2017-03-02 ENCOUNTER — Encounter: Payer: Self-pay | Admitting: Physician Assistant

## 2017-03-03 ENCOUNTER — Encounter: Payer: Self-pay | Admitting: Physician Assistant

## 2017-03-03 MED ORDER — SECNIDAZOLE 2 G PO PACK: 2.0000 g | PACK | Freq: Once | ORAL | 0 refills | Status: AC

## 2017-04-11 ENCOUNTER — Other Ambulatory Visit: Payer: Self-pay | Admitting: Physician Assistant

## 2017-04-11 DIAGNOSIS — J45990 Exercise induced bronchospasm: Secondary | ICD-10-CM

## 2017-05-17 ENCOUNTER — Encounter: Payer: Self-pay | Admitting: Gastroenterology

## 2017-05-20 DIAGNOSIS — R87610 Atypical squamous cells of undetermined significance on cytologic smear of cervix (ASC-US): Secondary | ICD-10-CM | POA: Diagnosis not present

## 2017-05-20 DIAGNOSIS — Z01419 Encounter for gynecological examination (general) (routine) without abnormal findings: Secondary | ICD-10-CM | POA: Diagnosis not present

## 2017-05-20 DIAGNOSIS — Z124 Encounter for screening for malignant neoplasm of cervix: Secondary | ICD-10-CM | POA: Diagnosis not present

## 2017-05-20 DIAGNOSIS — R399 Unspecified symptoms and signs involving the genitourinary system: Secondary | ICD-10-CM | POA: Diagnosis not present

## 2017-05-21 HISTORY — PX: INCISION / DRAINAGE HAND / FINGER: SUR695

## 2017-05-26 ENCOUNTER — Encounter: Payer: Self-pay | Admitting: Physician Assistant

## 2017-05-26 ENCOUNTER — Telehealth: Payer: Self-pay | Admitting: Physician Assistant

## 2017-05-26 ENCOUNTER — Ambulatory Visit: Payer: 59 | Admitting: Physician Assistant

## 2017-05-26 VITALS — BP 128/82 | HR 77 | Temp 98.0°F | Resp 16 | Ht 62.0 in | Wt 137.6 lb

## 2017-05-26 DIAGNOSIS — T148XXA Other injury of unspecified body region, initial encounter: Secondary | ICD-10-CM

## 2017-05-26 DIAGNOSIS — M7989 Other specified soft tissue disorders: Secondary | ICD-10-CM | POA: Diagnosis not present

## 2017-05-26 DIAGNOSIS — W5501XA Bitten by cat, initial encounter: Secondary | ICD-10-CM

## 2017-05-26 MED ORDER — CEFTRIAXONE SODIUM 250 MG IJ SOLR
250.0000 mg | Freq: Once | INTRAMUSCULAR | Status: AC
  Administered 2017-05-26: 250 mg via INTRAMUSCULAR

## 2017-05-26 MED ORDER — AMOXICILLIN-POT CLAVULANATE 875-125 MG PO TABS: 1.0000 | ORAL_TABLET | Freq: Two times a day (BID) | ORAL | 0 refills | Status: AC

## 2017-05-26 NOTE — Telephone Encounter (Signed)
Pt has appt at [facility] on 3/7 at 9:30am. Can we get ov notes completed today so we can fax these to them before her appt? Thank you!!

## 2017-05-26 NOTE — Progress Notes (Signed)
Rhonda Cruz  MRN: [id number] DOB: [birth year]  PCP: Harrison Mons, PA-C  Subjective:  Pt is a 52 year old female who presents to clinic for cat bite x 1 day. Last night she was bitten on her left and right hand and right lower leg after picking up her cat after a cat fight. Her cat bit her - cat is utd with shots.  Endorses drainage, pain and swelling of right hand. Slight numbness of right thumb. No decreased ROM of wrist or fingers. Denies redness or warmth of joint. No involvement of her forearm.   Last Tdap 2016  Of note, she was hospitalized 2 years ago after being bitten by this same cat on her wrist. The night she was bitten she received IM Rocephin and PO Augmentin. Her symptoms worsened and she was advised to proceed to the ED by orthopedist where she received IV antibiotics.    Review of Systems  Constitutional: Negative for chills, diaphoresis, fatigue and fever.  Musculoskeletal: Positive for arthralgias. Negative for joint swelling and myalgias.  Skin: Positive for wound.  Neurological: Positive for numbness. Negative for weakness.    Patient Active Problem List   Diagnosis Date Noted  . Dysmenorrhea 01/28/2017  . Uterine leiomyoma 01/28/2017  . HSV-2 infection 01/28/2017  . Immune to hepatitis B 11/15/2015  . Reactive depression 07/24/2015  . Insomnia 06/29/2015  . Exercise-induced asthma 06/29/2015  . Mass of breast, right upper outer quadrant 10/28/2010    Current Outpatient Medications on File Prior to Visit  Medication Sig Dispense Refill  . albuterol (PROVENTIL HFA;VENTOLIN HFA) 108 (90 Base) MCG/ACT inhaler Inhale 2 puffs every 6 (six) hours as needed into the lungs for wheezing. 1 Inhaler 5  . b complex vitamins tablet Take 1 tablet by mouth daily.    . fish oil-omega-3 fatty acids 1000 MG capsule Take 2 g by mouth daily.      Marland Kitchen FLUoxetine (PROZAC) 10 MG tablet Take 2 tablets (20 mg total) daily by mouth. 180 tablet 3  . Fluticasone-Salmeterol  (ADVAIR DISKUS) 250-50 MCG/DOSE AEPB Inhale 1 puff every 12 (twelve) hours into the lungs. 60 each 1  . Maca Root 500 MG CAPS Take 1 capsule by mouth 2 (two) times daily. Reported on 07/24/2015    . valACYclovir (VALTREX) 500 MG tablet Take 1 tablet (500 mg total) 2 (two) times daily by mouth. Use for 5 days, PRN outbreak (Patient not taking: Reported on 05/26/2017) 30 tablet prn   No current facility-administered medications on file prior to visit.     No Known Allergies   Objective:  BP (!) 146/81   Pulse 77   Temp 98 F (36.7 C) (Oral)   Resp 16   Ht 5\' 2"  (1.575 m)   Wt 137 lb 9.6 oz (62.4 kg)   LMP 11/14/2014   SpO2 100%   BMI 25.17 kg/m   Physical Exam  Constitutional: She is oriented to person, place, and time and well-developed, well-nourished, and in no distress. No distress.  Cardiovascular: Normal rate, regular rhythm and normal heart sounds.  Neurological: She is alert and oriented to person, place, and time. GCS score is 15.  Skin: Skin is warm and dry.     Psychiatric: Mood, memory, affect and judgment normal.  Vitals reviewed.        Puncture wounds of right dorsal hand irrigated with 150 cc normal saline.  Assessment and Plan :  1. Cat bite, initial encounter 2. Swelling of right hand 3.  Puncture wound - cefTRIAXone (ROCEPHIN) injection 250 mg - amoxicillin-clavulanate (AUGMENTIN) 875-125 MG tablet; Take 1 tablet by mouth 2 (two) times daily for 14 days.  Dispense: 27 tablet; Refill: 0 - Ambulatory referral to Hand Surgery - Pt presents with multiple cat bites of b/l hands and right lower leg x 1 day. H/o hospital admission with IV antibiotics s/p cat bite from this same cat. Wounds of right hand were irrigated and wound was dressed. Plan to cover with Rocephin and Augmentin. Will refer to hand for close follow-up, considering bite involving b/l hands with swelling, drainage and warmth of right hand. She has an appointment tomorrow.   Mercer Pod, PA-C    [facility] 05/26/2017 9:50 AM

## 2017-05-26 NOTE — Patient Instructions (Addendum)
[place] [place] [address] [phone], Appointment sche for Thursday, 05/27/17 at 9;30 am, arrive at 9:00 am  Start taking Augmentin twice daily for the next 14 days.    Deep puncture wounds are of particular concern because cats have long, slender, sharp teeth. When the hand is bitten, bacteria can get into the tissue that surrounds the bones or into a joint and result in osteomyelitis (infection of the bone) or septic arthritis (infection of the joint). If infection occurs, it generally progresses rapidly, causing skin redness, swelling, and intense pain as quickly as 12 to 24 hours after the bite.  Animal Bite Animal bites can range from mild to serious. An animal bite can result in a scratch on the skin, a deep open cut, a puncture of the skin, a crush injury, or tearing away of the skin or a body part. A small bite from a house pet will usually not cause serious problems. However, some animal bites can become infected or injure a bone or other tissue. Bites from certain animals can be more dangerous because of the risk of spreading rabies, which is a serious viral infection. This risk is higher with bites from stray animals or wild animals, such as raccoons, foxes, skunks, and bats. Dogs are responsible for most animal bites. Children are bitten more often than adults. What are the signs or symptoms? Common symptoms of an animal bite include:  Pain.  Bleeding.  Swelling.  Bruising.  How is this diagnosed? This condition may be diagnosed based on a physical exam and medical history. Your health care provider will examine the wound and ask for details about the animal and how the bite happened. You may also have tests, such as:  Blood tests to check for infection or to determine if surgery is needed.  X-rays to check for damage to bones or joints.  Culture test. This uses a sample of fluid from the wound to check for infection.  How is  this treated? Treatment varies depending on the location and type of animal bite and your medical history. Treatment may include:  Wound care. This often includes cleaning the wound, flushing the wound with saline solution, and applying a bandage (dressing). Sometimes, the wound is left open to heal because of the high risk of infection. However, in some cases, the wound may be closed with stitches (sutures), staples, skin glue, or adhesive strips.  Antibiotic medicine.  Tetanus shot.  Rabies treatment if the animal could have rabies.  In some cases, bites that have become infected may require IV antibiotics and surgical treatment in the hospital. Follow these instructions at home: Wound care  Follow instructions from your health care provider about how to take care of your wound. Make sure you: ? Wash your hands with soap and water before you change your dressing. If soap and water are not available, use hand sanitizer. ? Change your dressing as told by your health care provider. ? Leave sutures, skin glue, or adhesive strips in place. These skin closures may need to be in place for 2 weeks or longer. If adhesive strip edges start to loosen and curl up, you may trim the loose edges. Do not remove adhesive strips completely unless your health care provider tells you to do that.  Check your wound every day for signs of infection. Watch for: ? Increasing redness, swelling, or pain. ? Fluid, blood, or pus. General instructions  Take or apply over-the-counter and prescription medicines  only as told by your health care provider.  If you were prescribed an antibiotic, take or apply it as told by your health care provider. Do not stop using the antibiotic even if your condition improves.  Keep the injured area raised (elevated) above the level of your heart while you are sitting or lying down, if this is possible.  If directed, apply ice to the injured area. ? Put ice in a plastic  bag. ? Place a towel between your skin and the bag. ? Leave the ice on for 20 minutes, 2-3 times per day.  Keep all follow-up visits as told by your health care provider. This is important. Contact a health care provider if:  You have increasing redness, swelling, or pain at the site of your wound.  You have a general feeling of sickness (malaise).  You feel nauseous or you vomit.  You have pain that does not get better. Get help right away if:  You have a red streak extending away from your wound.  You have fluid, blood, or pus coming from your wound.  You have a fever or chills.  You have trouble moving your injured area.  You have numbness or tingling extending beyond the wound. This information is not intended to replace advice given to you by your health care provider. Make sure you discuss any questions you have with your health care provider. Document Released: 11/25/2010 Document Revised: 07/17/2015 Document Reviewed: 07/25/2014 Elsevier Interactive Patient Education  2018 [employer].  IF you received an x-ray today, you will receive an invoice from [facility]. Please contact [facility] at [phone] with questions or concerns regarding your invoice.   IF you received labwork today, you will receive an invoice from [facility]. Please contact [facility] at [phone] with questions or concerns regarding your invoice.   Our billing staff will not be able to assist you with questions regarding bills from these companies.  You will be contacted with the lab results as soon as they are available. The fastest way to get your results is to activate your My Chart account. Instructions are located on the last page of this paperwork. If you have not heard from [place] regarding the results in 2 weeks, please contact this office.

## 2017-05-26 NOTE — Telephone Encounter (Signed)
Note finished earlier today. Please be sure these were faxed.

## 2017-05-27 DIAGNOSIS — W5501XA Bitten by cat, initial encounter: Secondary | ICD-10-CM

## 2017-05-27 DIAGNOSIS — S61451A Open bite of right hand, initial encounter: Secondary | ICD-10-CM | POA: Insufficient documentation

## 2017-05-27 DIAGNOSIS — M79641 Pain in right hand: Secondary | ICD-10-CM | POA: Diagnosis not present

## 2017-05-28 DIAGNOSIS — M79641 Pain in right hand: Secondary | ICD-10-CM | POA: Diagnosis not present

## 2017-05-28 NOTE — Telephone Encounter (Signed)
Notes faxed on 3/6. Thanks!

## 2017-06-17 ENCOUNTER — Ambulatory Visit (AMBULATORY_SURGERY_CENTER): Payer: Self-pay | Admitting: *Deleted

## 2017-06-17 ENCOUNTER — Other Ambulatory Visit: Payer: Self-pay

## 2017-06-17 VITALS — Ht 62.0 in | Wt 141.2 lb

## 2017-06-17 DIAGNOSIS — Z1211 Encounter for screening for malignant neoplasm of colon: Secondary | ICD-10-CM

## 2017-06-17 MED ORDER — NA SULFATE-K SULFATE-MG SULF 17.5-3.13-1.6 GM/177ML PO SOLN: 1.0000 | Freq: Once | ORAL | 0 refills | Status: AC

## 2017-06-17 NOTE — Progress Notes (Signed)
Denies allergies to eggs or soy products. Denies complications with sedation or anesthesia. Denies O2 use. Denies use of diet or weight loss medications.  Emmi instructions given for colonoscopy.  

## 2017-06-22 ENCOUNTER — Encounter: Payer: Self-pay | Admitting: Gastroenterology

## 2017-06-28 ENCOUNTER — Encounter: Payer: Self-pay | Admitting: Physician Assistant

## 2017-07-01 ENCOUNTER — Encounter: Payer: Self-pay | Admitting: *Deleted

## 2017-07-06 ENCOUNTER — Encounter: Payer: Self-pay | Admitting: Gastroenterology

## 2017-07-06 ENCOUNTER — Other Ambulatory Visit: Payer: Self-pay

## 2017-07-06 ENCOUNTER — Ambulatory Visit (AMBULATORY_SURGERY_CENTER): Payer: 59 | Admitting: Gastroenterology

## 2017-07-06 VITALS — BP 137/83 | HR 66 | Temp 98.9°F | Resp 16 | Ht 62.0 in | Wt 141.0 lb

## 2017-07-06 DIAGNOSIS — Z1211 Encounter for screening for malignant neoplasm of colon: Secondary | ICD-10-CM | POA: Diagnosis present

## 2017-07-06 DIAGNOSIS — D122 Benign neoplasm of ascending colon: Secondary | ICD-10-CM | POA: Diagnosis not present

## 2017-07-06 DIAGNOSIS — D128 Benign neoplasm of rectum: Secondary | ICD-10-CM

## 2017-07-06 DIAGNOSIS — D129 Benign neoplasm of anus and anal canal: Secondary | ICD-10-CM

## 2017-07-06 MED ORDER — SODIUM CHLORIDE 0.9 % IV SOLN: 500.0000 mL | Freq: Once | INTRAVENOUS | Status: DC

## 2017-07-06 NOTE — Progress Notes (Signed)
Pt's states no medical or surgical changes since previsit or office visit. 

## 2017-07-06 NOTE — Progress Notes (Signed)
Report to PACU, RN, vss, BBS= Clear.  

## 2017-07-06 NOTE — Patient Instructions (Signed)
YOU HAD AN ENDOSCOPIC PROCEDURE TODAY AT [facility]:   Refer to the procedure report that was given to you for any specific questions about what was found during the examination.  If the procedure report does not answer your questions, please call your gastroenterologist to clarify.  If you requested that your care partner not be given the details of your procedure findings, then the procedure report has been included in a sealed envelope for you to review at your convenience later.  YOU SHOULD EXPECT: Some feelings of bloating in the abdomen. Passage of more gas than usual.  Walking can help get rid of the air that was put into your GI tract during the procedure and reduce the bloating. If you had a lower endoscopy (such as a colonoscopy or flexible sigmoidoscopy) you may notice spotting of blood in your stool or on the toilet paper. If you underwent a bowel prep for your procedure, you may not have a normal bowel movement for a few days.  Please Note:  You might notice some irritation and congestion in your nose or some drainage.  This is from the oxygen used during your procedure.  There is no need for concern and it should clear up in a day or so.  SYMPTOMS TO REPORT IMMEDIATELY:   Following lower endoscopy (colonoscopy or flexible sigmoidoscopy):  Excessive amounts of blood in the stool  Significant tenderness or worsening of abdominal pains  Swelling of the abdomen that is new, acute  Fever of 100F or higher  For urgent or emergent issues, a gastroenterologist can be reached at any hour by calling [phone].   DIET:  We do recommend a small meal at first, but then you may proceed to your regular diet.  Drink plenty of fluids but you should avoid alcoholic beverages for 24 hours.  ACTIVITY:  You should plan to take it easy for the rest of today and you should NOT DRIVE or use heavy machinery until tomorrow (because of the sedation medicines used during the test).     FOLLOW UP: Our staff will call the number listed on your records the next business day following your procedure to check on you and address any questions or concerns that you may have regarding the information given to you following your procedure. If we do not reach you, we will leave a message.  However, if you are feeling well and you are not experiencing any problems, there is no need to return our call.  We will assume that you have returned to your regular daily activities without incident.  If any biopsies were taken you will be contacted by phone or by letter within the next 1-3 weeks.  Please call us at [phone] if you have not heard about the biopsies in 3 weeks.   Await for biopsy results Polyps (handout given) Hemorrhoid (handout given)   SIGNATURES/CONFIDENTIALITY: You and/or your care partner have signed paperwork which will be entered into your electronic medical record.  These signatures attest to the fact that that the information above on your After Visit Summary has been reviewed and is understood.  Full responsibility of the confidentiality of this discharge information lies with you and/or your care-partner.

## 2017-07-06 NOTE — Op Note (Signed)
[facility] Patient Name: Rhonda Cruz Procedure Date: 07/06/2017 11:07 AM MRN: [id number] Endoscopist: Mauri Pole , MD Age: 52 Referring MD:  Date of Birth: Dec 26, 1965 Gender: Female Account #: [id number] Procedure:                Colonoscopy Indications:              Screening for colorectal malignant neoplasm Medicines:                Monitored Anesthesia Care Procedure:                Pre-Anesthesia Assessment:                           - Prior to the procedure, a History and Physical                            was performed, and patient medications and                            allergies were reviewed. The patient's tolerance of                            previous anesthesia was also reviewed. The risks                            and benefits of the procedure and the sedation                            options and risks were discussed with the patient.                            All questions were answered, and informed consent                            was obtained. Prior Anticoagulants: The patient has                            taken no previous anticoagulant or antiplatelet                            agents. ASA Grade Assessment: II - A patient with                            mild systemic disease. After reviewing the risks                            and benefits, the patient was deemed in                            satisfactory condition to undergo the procedure.                           After obtaining informed consent, the colonoscope  was passed under direct vision. Throughout the                            procedure, the patient's blood pressure, pulse, and                            oxygen saturations were monitored continuously. The                            Colonoscope was introduced through the anus and                            advanced to the the cecum, identified by                            appendiceal orifice  and ileocecal valve. The                            patient tolerated the procedure well. The quality                            of the bowel preparation was good. The ileocecal                            valve, appendiceal orifice, and rectum were                            photographed. The colonoscopy was somewhat                            difficult due to restricted mobility of the colon                            and a tortuous colon. Scope In: 11:17:43 AM Scope Out: 11:37:30 AM Scope Withdrawal Time: 0 hours 8 minutes 22 seconds  Total Procedure Duration: 0 hours 19 minutes 47 seconds  Findings:                 The perianal and digital rectal examinations were                            normal.                           A 1 mm polyp was found in the ascending colon. The                            polyp was sessile. The polyp was removed with a                            cold biopsy forceps. Resection and retrieval were                            complete.  A 12 mm polyp was found in the rectum. The polyp                            was semi-pedunculated. The polyp was removed with a                            hot snare. Resection and retrieval were complete.                           Non-bleeding internal hemorrhoids were found during                            retroflexion. The hemorrhoids were small. Complications:            No immediate complications. Estimated Blood Loss:     Estimated blood loss was minimal. Impression:               - One 1 mm polyp in the ascending colon, removed                            with a cold biopsy forceps. Resected and retrieved.                           - One 12 mm polyp in the rectum, removed with a hot                            snare. Resected and retrieved.                           - Non-bleeding internal hemorrhoids. Recommendation:           - Patient has a contact number available for                             emergencies. The signs and symptoms of potential                            delayed complications were discussed with the                            patient. Return to normal activities tomorrow.                            Written discharge instructions were provided to the                            patient.                           - Resume previous diet.                           - Continue present medications.                           - Await  pathology results.                           - Repeat colonoscopy in 3 years for surveillance                            based on pathology results. Mauri Pole, MD 07/06/2017 11:43:22 AM This report has been signed electronically.

## 2017-07-06 NOTE — Progress Notes (Signed)
Called to room to assist during endoscopic procedure.  Patient ID and intended procedure confirmed with present staff. Received instructions for my participation in the procedure from the performing physician.  

## 2017-07-07 ENCOUNTER — Telehealth: Payer: Self-pay | Admitting: *Deleted

## 2017-07-07 NOTE — Telephone Encounter (Signed)
  Follow up Call-  Call back number 07/06/2017  Post procedure Call Back phone  # [phone]  Permission to leave phone message Yes  Some recent data might be hidden     Patient questions:  Do you have a fever, pain , or abdominal swelling? No. Pain Score  0 *  Have you tolerated food without any problems? Yes.    Have you been able to return to your normal activities? Yes.    Do you have any questions about your discharge instructions: Diet   No. Medications  No. Follow up visit  No.  Do you have questions or concerns about your Care? No.  Actions: * If pain score is 4 or above: No action needed, pain <4.

## 2017-07-14 ENCOUNTER — Encounter: Payer: Self-pay | Admitting: Gastroenterology

## 2017-07-17 ENCOUNTER — Encounter: Payer: Self-pay | Admitting: Physician Assistant

## 2017-07-17 DIAGNOSIS — Z860100 Personal history of colon polyps, unspecified: Secondary | ICD-10-CM | POA: Insufficient documentation

## 2017-09-03 ENCOUNTER — Other Ambulatory Visit: Payer: Self-pay | Admitting: Physician Assistant

## 2017-09-03 DIAGNOSIS — J45990 Exercise induced bronchospasm: Secondary | ICD-10-CM

## 2017-10-29 DIAGNOSIS — Z1231 Encounter for screening mammogram for malignant neoplasm of breast: Secondary | ICD-10-CM | POA: Diagnosis not present

## 2017-11-10 ENCOUNTER — Other Ambulatory Visit: Payer: Self-pay | Admitting: Physician Assistant

## 2017-11-10 DIAGNOSIS — J45990 Exercise induced bronchospasm: Secondary | ICD-10-CM

## 2017-11-10 MED ORDER — FLUTICASONE-SALMETEROL 250-50 MCG/DOSE IN AEPB: 1.0000 | INHALATION_SPRAY | Freq: Two times a day (BID) | RESPIRATORY_TRACT | 0 refills | Status: DC

## 2017-11-10 NOTE — Telephone Encounter (Signed)
Copied from [facility] [id number]. Topic: Quick Communication - Rx Refill/Question >> Nov 10, 2017  9:02 AM Burchel, Abbi R wrote: Medication: Fluticasone-Salmeterol (ADVAIR DISKUS) 250-50 MCG/DOSE   Preferred [facility] [phone] [address] [place] [address] Phone: [phone] Fax: [phone]    Pt was advised that RX refills may take up to 3 business days.

## 2017-11-10 NOTE — Telephone Encounter (Signed)
advair diskus 250-50 mcg/does aepb  #1 no refills approved. Dgaddy, CMA

## 2018-01-20 ENCOUNTER — Other Ambulatory Visit: Payer: Self-pay | Admitting: Family Medicine

## 2018-01-20 DIAGNOSIS — J45990 Exercise induced bronchospasm: Secondary | ICD-10-CM

## 2018-01-20 NOTE — Telephone Encounter (Signed)
Requested Prescriptions  Pending Prescriptions Disp Refills  . ADVAIR DISKUS 250-50 MCG/DOSE AEPB [Pharmacy Med Name: ADVAIR DISKUS 250/50MCG (YELLOW) 60] 60 each 0    Sig: INHALE ONE PUFF BY MOUTH EVERY 12 HOURS     Pulmonology:  Combination Products Passed - 01/20/2018 10:32 AM      Passed - Valid encounter within last 12 months    Recent Outpatient Visits          7 months ago Cat bite, initial encounter   [facility] at [facility], Gelene Mink, PA-C   11 months ago Annual physical exam   [facility] at [facility], [place], [place]   1 year ago Dermatitis   Primary Care at [facility], [place], [place]   2 years ago Annual physical exam   Primary Care at [facility], [place], [place]   2 years ago Reactive depression   Primary Care at [facility], Linton Ham, MD

## 2018-02-23 ENCOUNTER — Other Ambulatory Visit: Payer: Self-pay

## 2018-02-23 DIAGNOSIS — F329 Major depressive disorder, single episode, unspecified: Secondary | ICD-10-CM

## 2018-02-23 MED ORDER — FLUOXETINE HCL 10 MG PO TABS: 20.0000 mg | ORAL_TABLET | Freq: Every day | ORAL | 0 refills | Status: DC

## 2018-03-03 ENCOUNTER — Ambulatory Visit (INDEPENDENT_AMBULATORY_CARE_PROVIDER_SITE_OTHER): Payer: 59 | Admitting: Emergency Medicine

## 2018-03-03 ENCOUNTER — Encounter: Payer: Self-pay | Admitting: Emergency Medicine

## 2018-03-03 ENCOUNTER — Other Ambulatory Visit: Payer: Self-pay

## 2018-03-03 VITALS — BP 122/77 | HR 64 | Temp 98.2°F | Resp 16 | Ht 62.0 in | Wt 142.4 lb

## 2018-03-03 DIAGNOSIS — Z13228 Encounter for screening for other metabolic disorders: Secondary | ICD-10-CM

## 2018-03-03 DIAGNOSIS — Z23 Encounter for immunization: Secondary | ICD-10-CM

## 2018-03-03 DIAGNOSIS — Z13220 Encounter for screening for lipoid disorders: Secondary | ICD-10-CM

## 2018-03-03 DIAGNOSIS — Z0001 Encounter for general adult medical examination with abnormal findings: Secondary | ICD-10-CM

## 2018-03-03 DIAGNOSIS — Z0000 Encounter for general adult medical examination without abnormal findings: Secondary | ICD-10-CM | POA: Diagnosis not present

## 2018-03-03 DIAGNOSIS — Z130 Encounter for screening for diseases of the blood and blood-forming organs and certain disorders involving the immune mechanism: Secondary | ICD-10-CM

## 2018-03-03 DIAGNOSIS — J45990 Exercise induced bronchospasm: Secondary | ICD-10-CM

## 2018-03-03 DIAGNOSIS — Z1329 Encounter for screening for other suspected endocrine disorder: Secondary | ICD-10-CM

## 2018-03-03 MED ORDER — ALBUTEROL SULFATE HFA 108 (90 BASE) MCG/ACT IN AERS: 2.0000 | INHALATION_SPRAY | Freq: Four times a day (QID) | RESPIRATORY_TRACT | 5 refills | Status: AC | PRN

## 2018-03-03 MED ORDER — ADVAIR DISKUS 250-50 MCG/DOSE IN AEPB: INHALATION_SPRAY | RESPIRATORY_TRACT | 7 refills | Status: DC

## 2018-03-03 NOTE — Patient Instructions (Addendum)
If you have lab work done today you will be contacted with your lab results within the next 2 weeks.  If you have not heard from [place] then please contact us. The fastest way to get your results is to register for My Chart.   IF you received an x-ray today, you will receive an invoice from [facility]. Please contact [facility] at [phone] with questions or concerns regarding your invoice.   IF you received labwork today, you will receive an invoice from [facility]. Please contact [facility] at [phone] with questions or concerns regarding your invoice.   Our billing staff will not be able to assist you with questions regarding bills from these companies.  You will be contacted with the lab results as soon as they are available. The fastest way to get your results is to activate your My Chart account. Instructions are located on the last page of this paperwork. If you have not heard from [place] regarding the results in 2 weeks, please contact this office.     Health Maintenance, Female Adopting a healthy lifestyle and getting preventive care can go a long way to promote health and wellness. Talk with your health care provider about what schedule of regular examinations is right for you. This is a good chance for you to check in with your provider about disease prevention and staying healthy. In between checkups, there are plenty of things you can do on your own. Experts have done a lot of research about which lifestyle changes and preventive measures are most likely to keep you healthy. Ask your health care provider for more information. Weight and diet Eat a healthy diet  Be sure to include plenty of vegetables, fruits, low-fat dairy products, and lean protein.  Do not eat a lot of foods high in solid fats, added sugars, or salt.  Get regular exercise. This is one of the most important things you can do for your health. ? Most adults should exercise for at least 150  minutes each week. The exercise should increase your heart rate and make you sweat (moderate-intensity exercise). ? Most adults should also do strengthening exercises at least twice a week. This is in addition to the moderate-intensity exercise.  Maintain a healthy weight  Body mass index (BMI) is a measurement that can be used to identify possible weight problems. It estimates body fat based on height and weight. Your health care provider can help determine your BMI and help you achieve or maintain a healthy weight.  For females 22 years of age and older: ? A BMI below 18.5 is considered underweight. ? A BMI of 18.5 to 24.9 is normal. ? A BMI of 25 to 29.9 is considered overweight. ? A BMI of 30 and above is considered obese.  Watch levels of cholesterol and blood lipids  You should start having your blood tested for lipids and cholesterol at 52 years of age, then have this test every 5 years.  You may need to have your cholesterol levels checked more often if: ? Your lipid or cholesterol levels are high. ? You are older than 52 years of age. ? You are at high risk for heart disease.  Cancer screening Lung Cancer  Lung cancer screening is recommended for adults 58-34 years old who are at high risk for lung cancer because of a history of smoking.  A yearly low-dose CT scan of the lungs is recommended for people who: ? Currently smoke. ? Have quit  within the past 15 years. ? Have at least a 30-pack-year history of smoking. A pack year is smoking an average of one pack of cigarettes a day for 1 year.  Yearly screening should continue until it has been 15 years since you quit.  Yearly screening should stop if you develop a health problem that would prevent you from having lung cancer treatment.  Breast Cancer  Practice breast self-awareness. This means understanding how your breasts normally appear and feel.  It also means doing regular breast self-exams. Let your health care  provider know about any changes, no matter how small.  If you are in your 20s or 30s, you should have a clinical breast exam (CBE) by a health care provider every 1-3 years as part of a regular health exam.  If you are 48 or older, have a CBE every year. Also consider having a breast X-ray (mammogram) every year.  If you have a family history of breast cancer, talk to your health care provider about genetic screening.  If you are at high risk for breast cancer, talk to your health care provider about having an MRI and a mammogram every year.  Breast cancer gene (BRCA) assessment is recommended for women who have family members with BRCA-related cancers. BRCA-related cancers include: ? Breast. ? Ovarian. ? Tubal. ? Peritoneal cancers.  Results of the assessment will determine the need for genetic counseling and BRCA1 and BRCA2 testing.  Cervical Cancer Your health care provider may recommend that you be screened regularly for cancer of the pelvic organs (ovaries, uterus, and vagina). This screening involves a pelvic examination, including checking for microscopic changes to the surface of your cervix (Pap test). You may be encouraged to have this screening done every 3 years, beginning at age 8.  For women ages 8-65, health care providers may recommend pelvic exams and Pap testing every 3 years, or they may recommend the Pap and pelvic exam, combined with testing for human papilloma virus (HPV), every 5 years. Some types of HPV increase your risk of cervical cancer. Testing for HPV may also be done on women of any age with unclear Pap test results.  Other health care providers may not recommend any screening for nonpregnant women who are considered low risk for pelvic cancer and who do not have symptoms. Ask your health care provider if a screening pelvic exam is right for you.  If you have had past treatment for cervical cancer or a condition that could lead to cancer, you need Pap tests  and screening for cancer for at least 20 years after your treatment. If Pap tests have been discontinued, your risk factors (such as having a new sexual partner) need to be reassessed to determine if screening should resume. Some women have medical problems that increase the chance of getting cervical cancer. In these cases, your health care provider may recommend more frequent screening and Pap tests.  Colorectal Cancer  This type of cancer can be detected and often prevented.  Routine colorectal cancer screening usually begins at 52 years of age and continues through 52 years of age.  Your health care provider may recommend screening at an earlier age if you have risk factors for colon cancer.  Your health care provider may also recommend using home test kits to check for hidden blood in the stool.  A small camera at the end of a tube can be used to examine your colon directly (sigmoidoscopy or colonoscopy). This is done to check  for the earliest forms of colorectal cancer.  Routine screening usually begins at age 75.  Direct examination of the colon should be repeated every 5-10 years through 52 years of age. However, you may need to be screened more often if early forms of precancerous polyps or small growths are found.  Skin Cancer  Check your skin from head to toe regularly.  Tell your health care provider about any new moles or changes in moles, especially if there is a change in a mole's shape or color.  Also tell your health care provider if you have a mole that is larger than the size of a pencil eraser.  Always use sunscreen. Apply sunscreen liberally and repeatedly throughout the day.  Protect yourself by wearing long sleeves, pants, a wide-brimmed hat, and sunglasses whenever you are outside.  Heart disease, diabetes, and high blood pressure  High blood pressure causes heart disease and increases the risk of stroke. High blood pressure is more likely to develop  in: ? People who have blood pressure in the high end of the normal range (130-139/85-89 mm Hg). ? People who are overweight or obese. ? People who are African American.  If you are 41-67 years of age, have your blood pressure checked every 3-5 years. If you are 32 years of age or older, have your blood pressure checked every year. You should have your blood pressure measured twice-once when you are at a hospital or clinic, and once when you are not at a hospital or clinic. Record the average of the two measurements. To check your blood pressure when you are not at a hospital or clinic, you can use: ? An automated blood pressure machine at a pharmacy. ? A home blood pressure monitor.  If you are between 44 years and 70 years old, ask your health care provider if you should take aspirin to prevent strokes.  Have regular diabetes screenings. This involves taking a blood sample to check your fasting blood sugar level. ? If you are at a normal weight and have a low risk for diabetes, have this test once every three years after 52 years of age. ? If you are overweight and have a high risk for diabetes, consider being tested at a younger age or more often. Preventing infection Hepatitis B  If you have a higher risk for hepatitis B, you should be screened for this virus. You are considered at high risk for hepatitis B if: ? You were born in a country where hepatitis B is common. Ask your health care provider which countries are considered high risk. ? Your parents were born in a high-risk country, and you have not been immunized against hepatitis B (hepatitis B vaccine). ? You have HIV or AIDS. ? You use needles to inject street drugs. ? You live with someone who has hepatitis B. ? You have had sex with someone who has hepatitis B. ? You get hemodialysis treatment. ? You take certain medicines for conditions, including cancer, organ transplantation, and autoimmune conditions.  Hepatitis C  Blood  testing is recommended for: ? Everyone born from [birth year] through 1965. ? Anyone with known risk factors for hepatitis C.  Sexually transmitted infections (STIs)  You should be screened for sexually transmitted infections (STIs) including gonorrhea and chlamydia if: ? You are sexually active and are younger than 52 years of age. ? You are older than 52 years of age and your health care provider tells you that you are at risk for  this type of infection. ? Your sexual activity has changed since you were last screened and you are at an increased risk for chlamydia or gonorrhea. Ask your health care provider if you are at risk.  If you do not have HIV, but are at risk, it may be recommended that you take a prescription medicine daily to prevent HIV infection. This is called pre-exposure prophylaxis (PrEP). You are considered at risk if: ? You are sexually active and do not regularly use condoms or know the HIV status of your partner(s). ? You take drugs by injection. ? You are sexually active with a partner who has HIV.  Talk with your health care provider about whether you are at high risk of being infected with HIV. If you choose to begin PrEP, you should first be tested for HIV. You should then be tested every 3 months for as long as you are taking PrEP. Pregnancy  If you are premenopausal and you may become pregnant, ask your health care provider about preconception counseling.  If you may become pregnant, take 400 to 800 micrograms (mcg) of folic acid every day.  If you want to prevent pregnancy, talk to your health care provider about birth control (contraception). Osteoporosis and menopause  Osteoporosis is a disease in which the bones lose minerals and strength with aging. This can result in serious bone fractures. Your risk for osteoporosis can be identified using a bone density scan.  If you are 68 years of age or older, or if you are at risk for osteoporosis and fractures, ask your  health care provider if you should be screened.  Ask your health care provider whether you should take a calcium or vitamin D supplement to lower your risk for osteoporosis.  Menopause may have certain physical symptoms and risks.  Hormone replacement therapy may reduce some of these symptoms and risks. Talk to your health care provider about whether hormone replacement therapy is right for you. Follow these instructions at home:  Schedule regular health, dental, and eye exams.  Stay current with your immunizations.  Do not use any tobacco products including cigarettes, chewing tobacco, or electronic cigarettes.  If you are pregnant, do not drink alcohol.  If you are breastfeeding, limit how much and how often you drink alcohol.  Limit alcohol intake to no more than 1 drink per day for nonpregnant women. One drink equals 12 ounces of beer, 5 ounces of wine, or 1 ounces of hard liquor.  Do not use street drugs.  Do not share needles.  Ask your health care provider for help if you need support or information about quitting drugs.  Tell your health care provider if you often feel depressed.  Tell your health care provider if you have ever been abused or do not feel safe at home. This information is not intended to replace advice given to you by your health care provider. Make sure you discuss any questions you have with your health care provider. Document Released: 09/22/2010 Document Revised: 08/15/2015 Document Reviewed: 12/11/2014 Elsevier Interactive Patient Education  [employer].

## 2018-03-03 NOTE — Progress Notes (Signed)
Rhonda Cruz 53 y.o.   Chief Complaint  Patient presents with  . Annual Exam    HISTORY OF PRESENT ILLNESS: This is a 52 y.o. female here for her annual exam.  Has no complaints or medical concerns. Has a history of chronic asthma.  Under control. Non-smoker.  Exercises regularly.  Good nutrition. Up-to-date with colonoscopy, Pap smear, mammogram.  HPI   Prior to Admission medications   Medication Sig Start Date End Date Taking? Authorizing Provider  ADVAIR DISKUS 250-50 MCG/DOSE AEPB INHALE 1 PUFF INTO THE LUNGS EVERY 12 HOURS 09/03/17  Yes Jeffery, [name], PA  albuterol (PROVENTIL HFA;VENTOLIN HFA) 108 (90 Base) MCG/ACT inhaler Inhale 2 puffs every 6 (six) hours as needed into the lungs for wheezing. 01/28/17 11/15/19 Yes Jeffery, [name], PA  b complex vitamins tablet Take 1 tablet by mouth daily.   Yes [provider]  fish oil-omega-3 fatty acids 1000 MG capsule Take 2 g by mouth daily.     Yes [provider]  FLUoxetine (PROZAC) 10 MG tablet Take 2 tablets (20 mg total) by mouth daily. 02/23/18  Yes [name], MD  ADVAIR DISKUS 250-50 MCG/DOSE AEPB INHALE ONE PUFF BY MOUTH EVERY 12 HOURS 01/20/18   Rutherford Guys, MD    No Known Allergies  Patient Active Problem List   Diagnosis Date Noted  . History of colonic polyps 07/17/2017  . Cat bite of hand, right, initial encounter 05/27/2017  . Dysmenorrhea 01/28/2017  . Uterine leiomyoma 01/28/2017  . HSV-2 infection 01/28/2017  . Immune to hepatitis B 11/15/2015  . Reactive depression 07/24/2015  . Insomnia 06/29/2015  . Exercise-induced asthma 06/29/2015  . Mass of breast, right upper outer quadrant 10/28/2010    Past Medical History:  Diagnosis Date  . Anemia   . Asthma   . Depression   . Fatigue   . Fibroids   . HPV (human papilloma virus) infection   . HSV infection     Past Surgical History:  Procedure Laterality Date  . CERVIX LESION DESTRUCTION    . COLONOSCOPY  1991   . CYSTECTOMY     knee  . INCISION / DRAINAGE HAND / FINGER  05/2017  . TMJ ARTHROPLASTY Bilateral 1988  . TONSILLECTOMY AND ADENOIDECTOMY      Social History   Socioeconomic History  . Marital status: Divorced    Spouse name: n/a  . Number of children: 0  . Years of education: Master's +  . Highest education level: Master's degree (e.g., MA, MS, MEng, MEd, MSW, MBA)  Occupational History  . 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 Financial resource strain: Not on file  . Food insecurity:    Worry: Not on file    Inability: Not on file  . Transportation needs:    Medical: Not on file    Non-medical: Not on file  Tobacco Use  . Smoking status: Never Smoker  . Smokeless tobacco: Never Used  . Tobacco comment: smoked some in teen years  Substance and Sexual Activity  . Alcohol use: Yes    Alcohol/week: 3.0 - 1.0 standard drinks    Types: 3 - 1 Glasses of wine per week    Comment: 1-3 drinks/ 2-3 days a week  . Drug use: Yes    Comment: occas THC  . Sexual activity: Not Currently    Partners: Male    Birth control/protection: Abstinence  Lifestyle  . Physical activity:    Days per  week: Not on file    Minutes per session: Not on file  . Stress: Not on file  Relationships  . Social connections:    Talks on phone: Not on file    Gets together: Not on file    Attends religious service: Not on file    Active member of club or organization: Not on file    Attends meetings of clubs or organizations: Not on file    Relationship status: Not on file  . Intimate partner violence:    Fear of current or ex partner: Not on file    Emotionally abused: Not on file    Physically abused: Not on file    Forced sexual activity: Not on file  Other Topics Concern  . Not on file  Social History Narrative   3 Master's Degrees: MFA Interdisciplinary Arts, Med Theater Ed, Edm Arboriculturist and Psychology   Lives alone in a condo. She has someone who  stays over frequently, but does not live with her.   She has a dog and cat.   Family lives locally     Family History  Problem Relation Age of Onset  . Cancer Mother 31       breast, s/p lumpectomy  . Asthma Mother   . Hypertension Father   . Cancer Father        skin  . Skin cancer Father   . Cancer Brother        signs of skin cancer   . Hypertension Brother   . Depression Brother   . Colon cancer Neg Hx   . Esophageal cancer Neg Hx   . Rectal cancer Neg Hx   . Stomach cancer Neg Hx      Review of Systems  Constitutional: Negative.  Negative for fever.  HENT: Negative.  Negative for hearing loss.   Eyes: Negative.  Negative for blurred vision.  Respiratory: Negative.  Negative for cough, hemoptysis and shortness of breath.   Cardiovascular: Negative.  Negative for chest pain, palpitations and leg swelling.  Gastrointestinal: Negative.  Negative for abdominal pain, diarrhea, nausea and vomiting.  Genitourinary: Negative.   Musculoskeletal: Negative.  Negative for myalgias.  Skin: Negative.  Negative for rash.  Neurological: Negative.  Negative for dizziness and headaches.  Endo/Heme/Allergies: Negative.   All other systems reviewed and are negative.   Vitals:   03/03/18 1603  BP: 122/77  Pulse: 64  Resp: 16  Temp: 98.2 F (36.8 C)  SpO2: 100%    Physical Exam Vitals signs reviewed.  Constitutional:      Appearance: Normal appearance.  HENT:     Head: Normocephalic and atraumatic.     Right Ear: Tympanic membrane, ear canal and external ear normal.     Left Ear: Tympanic membrane, ear canal and external ear normal.     Nose: Nose normal.     Mouth/Throat:     Mouth: Mucous membranes are moist.     Pharynx: Oropharynx is clear.  Eyes:     Extraocular Movements: Extraocular movements intact.     Conjunctiva/sclera: Conjunctivae normal.     Pupils: Pupils are equal, round, and reactive to light.  Neck:     Musculoskeletal: Normal range of motion.      Vascular: No carotid bruit.  Cardiovascular:     Rate and Rhythm: Normal rate and regular rhythm.     Pulses: Normal pulses.     Heart sounds: Normal heart sounds.  Pulmonary:  Effort: Pulmonary effort is normal.     Breath sounds: Normal breath sounds.  Abdominal:     General: Abdomen is flat. There is no distension.     Tenderness: There is no abdominal tenderness.  Musculoskeletal: Normal range of motion.  Skin:    General: Skin is warm and dry.     Capillary Refill: Capillary refill takes less than 2 seconds.  Neurological:     General: No focal deficit present.     Mental Status: She is alert and oriented to person, place, and time.     Sensory: No sensory deficit.     Motor: No weakness.     Coordination: Coordination normal.  Psychiatric:        Mood and Affect: Mood normal.        Behavior: Behavior normal.      ASSESSMENT & PLAN:  Rhonda Cruz was seen today for annual exam.  Diagnoses and all orders for this visit:  Routine general medical examination at a health care facility -     Comprehensive metabolic panel  Screening for deficiency anemia -     CBC with Differential  Screening for lipoid disorders -     Lipid panel  Screening for endocrine, metabolic and immunity disorder -     Lipid panel -     TSH -     Comprehensive metabolic panel  Need for prophylactic vaccination and inoculation against influenza -     Flu Vaccine QUAD 36+ mos IM  Exercise-induced asthma -     albuterol (PROVENTIL HFA;VENTOLIN HFA) 108 (90 Base) MCG/ACT inhaler; Inhale 2 puffs into the lungs every 6 (six) hours as needed for wheezing. -     ADVAIR DISKUS 250-50 MCG/DOSE AEPB; INHALE 1 PUFF INTO THE LUNGS EVERY 12 HOURS   Patient Instructions       If you have lab work done today you will be contacted with your lab results within the next 2 weeks.  If you have not heard from [place] then please contact us. The fastest way to get your results is to register for My  Chart.   IF you received an x-ray today, you will receive an invoice from [facility]. Please contact [facility] at [phone] with questions or concerns regarding your invoice.   IF you received labwork today, you will receive an invoice from [facility]. Please contact [facility] at [phone] with questions or concerns regarding your invoice.   Our billing staff will not be able to assist you with questions regarding bills from these companies.  You will be contacted with the lab results as soon as they are available. The fastest way to get your results is to activate your My Chart account. Instructions are located on the last page of this paperwork. If you have not heard from [place] regarding the results in 2 weeks, please contact this office.     Health Maintenance, Female Adopting a healthy lifestyle and getting preventive care can go a long way to promote health and wellness. Talk with your health care provider about what schedule of regular examinations is right for you. This is a good chance for you to check in with your provider about disease prevention and staying healthy. In between checkups, there are plenty of things you can do on your own. Experts have done a lot of research about which lifestyle changes and preventive measures are most likely to keep you healthy. Ask your health care provider for more information. Weight and diet Eat  a healthy diet  Be sure to include plenty of vegetables, fruits, low-fat dairy products, and lean protein.  Do not eat a lot of foods high in solid fats, added sugars, or salt.  Get regular exercise. This is one of the most important things you can do for your health. ? Most adults should exercise for at least 150 minutes each week. The exercise should increase your heart rate and make you sweat (moderate-intensity exercise). ? Most adults should also do strengthening exercises at least twice a week. This is in addition to the  moderate-intensity exercise.  Maintain a healthy weight  Body mass index (BMI) is a measurement that can be used to identify possible weight problems. It estimates body fat based on height and weight. Your health care provider can help determine your BMI and help you achieve or maintain a healthy weight.  For females 34 years of age and older: ? A BMI below 18.5 is considered underweight. ? A BMI of 18.5 to 24.9 is normal. ? A BMI of 25 to 29.9 is considered overweight. ? A BMI of 30 and above is considered obese.  Watch levels of cholesterol and blood lipids  You should start having your blood tested for lipids and cholesterol at 52 years of age, then have this test every 5 years.  You may need to have your cholesterol levels checked more often if: ? Your lipid or cholesterol levels are high. ? You are older than 52 years of age. ? You are at high risk for heart disease.  Cancer screening Lung Cancer  Lung cancer screening is recommended for adults 11-82 years old who are at high risk for lung cancer because of a history of smoking.  A yearly low-dose CT scan of the lungs is recommended for people who: ? Currently smoke. ? Have quit within the past 15 years. ? Have at least a 30-pack-year history of smoking. A pack year is smoking an average of one pack of cigarettes a day for 1 year.  Yearly screening should continue until it has been 15 years since you quit.  Yearly screening should stop if you develop a health problem that would prevent you from having lung cancer treatment.  Breast Cancer  Practice breast self-awareness. This means understanding how your breasts normally appear and feel.  It also means doing regular breast self-exams. Let your health care provider know about any changes, no matter how small.  If you are in your 20s or 30s, you should have a clinical breast exam (CBE) by a health care provider every 1-3 years as part of a regular health exam.  If you  are 72 or older, have a CBE every year. Also consider having a breast X-ray (mammogram) every year.  If you have a family history of breast cancer, talk to your health care provider about genetic screening.  If you are at high risk for breast cancer, talk to your health care provider about having an MRI and a mammogram every year.  Breast cancer gene (BRCA) assessment is recommended for women who have family members with BRCA-related cancers. BRCA-related cancers include: ? Breast. ? Ovarian. ? Tubal. ? Peritoneal cancers.  Results of the assessment will determine the need for genetic counseling and BRCA1 and BRCA2 testing.  Cervical Cancer Your health care provider may recommend that you be screened regularly for cancer of the pelvic organs (ovaries, uterus, and vagina). This screening involves a pelvic examination, including checking for microscopic changes to the surface  of your cervix (Pap test). You may be encouraged to have this screening done every 3 years, beginning at age 64.  For women ages 14-65, health care providers may recommend pelvic exams and Pap testing every 3 years, or they may recommend the Pap and pelvic exam, combined with testing for human papilloma virus (HPV), every 5 years. Some types of HPV increase your risk of cervical cancer. Testing for HPV may also be done on women of any age with unclear Pap test results.  Other health care providers may not recommend any screening for nonpregnant women who are considered low risk for pelvic cancer and who do not have symptoms. Ask your health care provider if a screening pelvic exam is right for you.  If you have had past treatment for cervical cancer or a condition that could lead to cancer, you need Pap tests and screening for cancer for at least 20 years after your treatment. If Pap tests have been discontinued, your risk factors (such as having a new sexual partner) need to be reassessed to determine if screening should  resume. Some women have medical problems that increase the chance of getting cervical cancer. In these cases, your health care provider may recommend more frequent screening and Pap tests.  Colorectal Cancer  This type of cancer can be detected and often prevented.  Routine colorectal cancer screening usually begins at 52 years of age and continues through 52 years of age.  Your health care provider may recommend screening at an earlier age if you have risk factors for colon cancer.  Your health care provider may also recommend using home test kits to check for hidden blood in the stool.  A small camera at the end of a tube can be used to examine your colon directly (sigmoidoscopy or colonoscopy). This is done to check for the earliest forms of colorectal cancer.  Routine screening usually begins at age 45.  Direct examination of the colon should be repeated every 5-10 years through 52 years of age. However, you may need to be screened more often if early forms of precancerous polyps or small growths are found.  Skin Cancer  Check your skin from head to toe regularly.  Tell your health care provider about any new moles or changes in moles, especially if there is a change in a mole's shape or color.  Also tell your health care provider if you have a mole that is larger than the size of a pencil eraser.  Always use sunscreen. Apply sunscreen liberally and repeatedly throughout the day.  Protect yourself by wearing long sleeves, pants, a wide-brimmed hat, and sunglasses whenever you are outside.  Heart disease, diabetes, and high blood pressure  High blood pressure causes heart disease and increases the risk of stroke. High blood pressure is more likely to develop in: ? People who have blood pressure in the high end of the normal range (130-139/85-89 mm Hg). ? People who are overweight or obese. ? People who are African American.  If you are 78-87 years of age, have your blood  pressure checked every 3-5 years. If you are 32 years of age or older, have your blood pressure checked every year. You should have your blood pressure measured twice-once when you are at a hospital or clinic, and once when you are not at a hospital or clinic. Record the average of the two measurements. To check your blood pressure when you are not at a hospital or clinic, you can use: ?  An automated blood pressure machine at a pharmacy. ? A home blood pressure monitor.  If you are between 40 years and 25 years old, ask your health care provider if you should take aspirin to prevent strokes.  Have regular diabetes screenings. This involves taking a blood sample to check your fasting blood sugar level. ? If you are at a normal weight and have a low risk for diabetes, have this test once every three years after 52 years of age. ? If you are overweight and have a high risk for diabetes, consider being tested at a younger age or more often. Preventing infection Hepatitis B  If you have a higher risk for hepatitis B, you should be screened for this virus. You are considered at high risk for hepatitis B if: ? You were born in a country where hepatitis B is common. Ask your health care provider which countries are considered high risk. ? Your parents were born in a high-risk country, and you have not been immunized against hepatitis B (hepatitis B vaccine). ? You have HIV or AIDS. ? You use needles to inject street drugs. ? You live with someone who has hepatitis B. ? You have had sex with someone who has hepatitis B. ? You get hemodialysis treatment. ? You take certain medicines for conditions, including cancer, organ transplantation, and autoimmune conditions.  Hepatitis C  Blood testing is recommended for: ? Everyone born from [birth year] through 1965. ? Anyone with known risk factors for hepatitis C.  Sexually transmitted infections (STIs)  You should be screened for sexually transmitted  infections (STIs) including gonorrhea and chlamydia if: ? You are sexually active and are younger than 52 years of age. ? You are older than 52 years of age and your health care provider tells you that you are at risk for this type of infection. ? Your sexual activity has changed since you were last screened and you are at an increased risk for chlamydia or gonorrhea. Ask your health care provider if you are at risk.  If you do not have HIV, but are at risk, it may be recommended that you take a prescription medicine daily to prevent HIV infection. This is called pre-exposure prophylaxis (PrEP). You are considered at risk if: ? You are sexually active and do not regularly use condoms or know the HIV status of your partner(s). ? You take drugs by injection. ? You are sexually active with a partner who has HIV.  Talk with your health care provider about whether you are at high risk of being infected with HIV. If you choose to begin PrEP, you should first be tested for HIV. You should then be tested every 3 months for as long as you are taking PrEP. Pregnancy  If you are premenopausal and you may become pregnant, ask your health care provider about preconception counseling.  If you may become pregnant, take 400 to 800 micrograms (mcg) of folic acid every day.  If you want to prevent pregnancy, talk to your health care provider about birth control (contraception). Osteoporosis and menopause  Osteoporosis is a disease in which the bones lose minerals and strength with aging. This can result in serious bone fractures. Your risk for osteoporosis can be identified using a bone density scan.  If you are 30 years of age or older, or if you are at risk for osteoporosis and fractures, ask your health care provider if you should be screened.  Ask your health care provider whether you  should take a calcium or vitamin D supplement to lower your risk for osteoporosis.  Menopause may have certain physical  symptoms and risks.  Hormone replacement therapy may reduce some of these symptoms and risks. Talk to your health care provider about whether hormone replacement therapy is right for you. Follow these instructions at home:  Schedule regular health, dental, and eye exams.  Stay current with your immunizations.  Do not use any tobacco products including cigarettes, chewing tobacco, or electronic cigarettes.  If you are pregnant, do not drink alcohol.  If you are breastfeeding, limit how much and how often you drink alcohol.  Limit alcohol intake to no more than 1 drink per day for nonpregnant women. One drink equals 12 ounces of beer, 5 ounces of wine, or 1 ounces of hard liquor.  Do not use street drugs.  Do not share needles.  Ask your health care provider for help if you need support or information about quitting drugs.  Tell your health care provider if you often feel depressed.  Tell your health care provider if you have ever been abused or do not feel safe at home. This information is not intended to replace advice given to you by your health care provider. Make sure you discuss any questions you have with your health care provider. Document Released: 09/22/2010 Document Revised: 08/15/2015 Document Reviewed: 12/11/2014 Elsevier Interactive Patient Education  2018 Elsevier Inc.     Agustina Caroli, MD [facility]

## 2018-03-04 ENCOUNTER — Encounter: Payer: Self-pay | Admitting: *Deleted

## 2018-03-04 LAB — CBC WITH DIFFERENTIAL/PLATELET
Basophils Absolute: 0.1 10*3/uL (ref 0.0–0.2)
Basos: 1 %
EOS (ABSOLUTE): 0.3 10*3/uL (ref 0.0–0.4)
EOS: 4 %
Hematocrit: 39.7 % (ref 34.0–46.6)
Hemoglobin: 13.7 g/dL (ref 11.1–15.9)
IMMATURE GRANS (ABS): 0 10*3/uL (ref 0.0–0.1)
Immature Granulocytes: 0 %
Lymphocytes Absolute: 2.1 10*3/uL (ref 0.7–3.1)
Lymphs: 32 %
MCH: 30.2 pg (ref 26.6–33.0)
MCHC: 34.5 g/dL (ref 31.5–35.7)
MCV: 88 fL (ref 79–97)
MONOCYTES: 8 %
Monocytes Absolute: 0.5 10*3/uL (ref 0.1–0.9)
Neutrophils Absolute: 3.6 10*3/uL (ref 1.4–7.0)
Neutrophils: 55 %
Platelets: 369 10*3/uL (ref 150–450)
RBC: 4.53 x10E6/uL (ref 3.77–5.28)
RDW: 12.3 % (ref 12.3–15.4)
WBC: 6.5 10*3/uL (ref 3.4–10.8)

## 2018-03-04 LAB — COMPREHENSIVE METABOLIC PANEL
ALT: 14 IU/L (ref 0–32)
AST: 17 IU/L (ref 0–40)
Albumin/Globulin Ratio: 1.9 (ref 1.2–2.2)
Albumin: 4.5 g/dL (ref 3.5–5.5)
Alkaline Phosphatase: 46 IU/L (ref 39–117)
BUN/Creatinine Ratio: 16 (ref 9–23)
BUN: 12 mg/dL (ref 6–24)
Bilirubin Total: 0.8 mg/dL (ref 0.0–1.2)
CO2: 23 mmol/L (ref 20–29)
Calcium: 9.5 mg/dL (ref 8.7–10.2)
Chloride: 101 mmol/L (ref 96–106)
Creatinine, Ser: 0.74 mg/dL (ref 0.57–1.00)
GFR calc Af Amer: 108 mL/min/{1.73_m2} (ref >59)
GFR calc non Af Amer: 93 mL/min/{1.73_m2} (ref >59)
Globulin, Total: 2.4 g/dL (ref 1.5–4.5)
Glucose: 84 mg/dL (ref 65–99)
Potassium: 3.9 mmol/L (ref 3.5–5.2)
Sodium: 140 mmol/L (ref 134–144)
Total Protein: 6.9 g/dL (ref 6.0–8.5)

## 2018-03-04 LAB — TSH: TSH: 1.48 u[IU]/mL (ref 0.450–4.500)

## 2018-03-04 LAB — LIPID PANEL
CHOL/HDL RATIO: 2.3 ratio (ref 0.0–4.4)
Cholesterol, Total: 247 mg/dL — ABNORMAL HIGH (ref 100–199)
HDL: 108 mg/dL (ref >39)
LDL Calculated: 130 mg/dL — ABNORMAL HIGH (ref 0–99)
Triglycerides: 45 mg/dL (ref 0–149)
VLDL Cholesterol Cal: 9 mg/dL (ref 5–40)

## 2018-03-22 ENCOUNTER — Other Ambulatory Visit: Payer: Self-pay | Admitting: Emergency Medicine

## 2018-03-22 DIAGNOSIS — F329 Major depressive disorder, single episode, unspecified: Secondary | ICD-10-CM

## 2018-03-24 ENCOUNTER — Other Ambulatory Visit: Payer: Self-pay | Admitting: Emergency Medicine

## 2018-03-24 DIAGNOSIS — F329 Major depressive disorder, single episode, unspecified: Secondary | ICD-10-CM

## 2018-03-24 MED ORDER — FLUOXETINE HCL 10 MG PO TABS: 20.0000 mg | ORAL_TABLET | Freq: Every day | ORAL | 5 refills | Status: AC

## 2018-03-24 NOTE — Telephone Encounter (Signed)
Copied from [place] [id number]. Topic: Quick Communication - Rx Refill/Question >> Mar 24, 2018  1:28 PM Leward Quan A wrote: Medication: FLUoxetine (PROZAC) 10 MG tablet   Per patient she is all out of this medication  Has the patient contacted their pharmacy? Yes.   (Agent: If no, request that the patient contact the pharmacy for the refill.) (Agent: If yes, when and what did the pharmacy advise?)  Preferred Pharmacy (with phone number or street name): [facility] [place], [place] - [address] [phone] (Phone) [phone] (Fax)    Agent: Please be advised that RX refills may take up to 3 business days. We ask that you follow-up with your pharmacy.

## 2018-07-21 ENCOUNTER — Other Ambulatory Visit: Payer: Self-pay | Admitting: *Deleted

## 2018-07-21 ENCOUNTER — Telehealth: Payer: Self-pay | Admitting: Emergency Medicine

## 2018-07-21 DIAGNOSIS — J45990 Exercise induced bronchospasm: Secondary | ICD-10-CM

## 2018-07-21 MED ORDER — FLUTICASONE-SALMETEROL 250-50 MCG/DOSE IN AEPB: INHALATION_SPRAY | RESPIRATORY_TRACT | 0 refills | Status: DC

## 2018-07-21 NOTE — Telephone Encounter (Signed)
Prescription sent

## 2018-07-21 NOTE — Telephone Encounter (Signed)
Copied from [place] [id number]. Topic: Quick Communication - Rx Refill/Question >> Jul 21, 2018  1:45 PM Rayann Heman wrote: Medication:ADVAIR DISKUS 250-50 MCG/DOSE AEPB [[id number]] needs 90 days for insurance   Has the patient contacted their pharmacy? Preferred Pharmacy (with phone number or street name):  Agent: Please be advised that RX refills may take up to 3 business days. We ask that you follow-up with your pharmacy.

## 2018-08-21 ENCOUNTER — Other Ambulatory Visit: Payer: Self-pay | Admitting: Emergency Medicine

## 2018-08-21 DIAGNOSIS — J45990 Exercise induced bronchospasm: Secondary | ICD-10-CM

## 2018-10-14 ENCOUNTER — Other Ambulatory Visit: Payer: Self-pay | Admitting: Emergency Medicine

## 2018-10-14 DIAGNOSIS — J45990 Exercise induced bronchospasm: Secondary | ICD-10-CM

## 2018-10-14 MED ORDER — FLUTICASONE-SALMETEROL 250-50 MCG/DOSE IN AEPB: INHALATION_SPRAY | RESPIRATORY_TRACT | 6 refills | Status: AC

## 2019-05-29 ENCOUNTER — Ambulatory Visit: Payer: Self-pay | Attending: Internal Medicine

## 2019-05-29 DIAGNOSIS — Z23 Encounter for immunization: Secondary | ICD-10-CM | POA: Insufficient documentation

## 2019-06-22 ENCOUNTER — Ambulatory Visit: Payer: Self-pay | Attending: Internal Medicine

## 2019-06-22 DIAGNOSIS — Z23 Encounter for immunization: Secondary | ICD-10-CM

## 2019-06-22 NOTE — Progress Notes (Signed)
   Covid-19 Vaccination Clinic  Name:  Rhonda Cruz    MRN: [id number] DOB: [birth year]  06/22/2019  [name] was observed post Covid-19 immunization for 15 minutes without incident. She was provided with Vaccine Information Sheet and instruction to access the V-Safe system.   [name] was instructed to call 911 with any severe reactions post vaccine: Marland Kitchen Difficulty breathing  . Swelling of face and throat  . A fast heartbeat  . A bad rash all over body  . Dizziness and weakness   Immunizations Administered    Name Date Dose VIS Date Route   Pfizer COVID-19 Vaccine 06/22/2019  8:32 AM 0.3 mL 03/03/2019 Intramuscular   Manufacturer: [place]   Lot: [id number]   [place]: [id number]

## 2019-06-28 ENCOUNTER — Ambulatory Visit: Payer: Self-pay

## 2020-01-02 ENCOUNTER — Ambulatory Visit: Payer: Self-pay | Attending: Critical Care Medicine

## 2020-01-02 DIAGNOSIS — Z23 Encounter for immunization: Secondary | ICD-10-CM

## 2020-01-02 NOTE — Progress Notes (Signed)
   Covid-19 Vaccination Clinic  Name:  Rhonda Cruz    MRN: [id number] DOB: 06/19/1965  01/02/2020  [name] was observed post Covid-19 immunization for 15 minutes without incident. She was provided with Vaccine Information Sheet and instruction to access the V-Safe system.   [name] was instructed to call 911 with any severe reactions post vaccine: Marland Kitchen Difficulty breathing  . Swelling of face and throat  . A fast heartbeat  . A bad rash all over body  . Dizziness and weakness

## 2020-02-01 ENCOUNTER — Telehealth: Payer: Self-pay | Admitting: *Deleted

## 2020-02-01 NOTE — Telephone Encounter (Signed)
Patient has moved to a new practice

## 2020-02-27 ENCOUNTER — Other Ambulatory Visit: Payer: Self-pay | Admitting: Family Medicine

## 2020-02-27 DIAGNOSIS — E2839 Other primary ovarian failure: Secondary | ICD-10-CM

## 2020-03-13 NOTE — Telephone Encounter (Signed)
Patient has moved to a new practice  

## 2020-04-10 ENCOUNTER — Other Ambulatory Visit: Payer: Self-pay | Admitting: Family Medicine

## 2020-04-17 ENCOUNTER — Other Ambulatory Visit: Payer: Self-pay | Admitting: Family Medicine

## 2020-04-17 DIAGNOSIS — Z1231 Encounter for screening mammogram for malignant neoplasm of breast: Secondary | ICD-10-CM

## 2020-05-22 ENCOUNTER — Other Ambulatory Visit: Payer: Self-pay

## 2020-07-22 ENCOUNTER — Encounter: Payer: Self-pay | Admitting: Gastroenterology

## 2020-08-02 ENCOUNTER — Other Ambulatory Visit: Payer: Self-pay | Admitting: Family Medicine

## 2020-08-02 DIAGNOSIS — N644 Mastodynia: Secondary | ICD-10-CM

## 2020-08-08 ENCOUNTER — Ambulatory Visit: Payer: Self-pay

## 2020-08-08 ENCOUNTER — Other Ambulatory Visit: Payer: Self-pay

## 2020-08-08 ENCOUNTER — Ambulatory Visit
Admission: RE | Admit: 2020-08-08 | Discharge: 2020-08-08 | Disposition: A | Discharge status: Home / Self Care | Payer: Self-pay | Source: Ambulatory Visit | Attending: Family Medicine | Admitting: Family Medicine

## 2020-08-08 DIAGNOSIS — E2839 Other primary ovarian failure: Secondary | ICD-10-CM

## 2020-08-21 ENCOUNTER — Other Ambulatory Visit: Payer: Self-pay

## 2020-08-21 ENCOUNTER — Ambulatory Visit
Admission: RE | Admit: 2020-08-21 | Discharge: 2020-08-21 | Disposition: A | Discharge status: Home / Self Care | Payer: PRIVATE HEALTH INSURANCE | Source: Ambulatory Visit | Attending: Family Medicine | Admitting: Family Medicine

## 2020-08-21 ENCOUNTER — Ambulatory Visit: Payer: Self-pay

## 2020-08-21 DIAGNOSIS — N644 Mastodynia: Secondary | ICD-10-CM

## 2021-03-23 HISTORY — PX: TOTAL HIP ARTHROPLASTY: SHX124

## 2021-05-06 ENCOUNTER — Other Ambulatory Visit: Payer: Self-pay | Admitting: Physician Assistant

## 2021-05-06 ENCOUNTER — Ambulatory Visit
Admission: RE | Admit: 2021-05-06 | Discharge: 2021-05-06 | Disposition: A | Discharge status: Home / Self Care | Payer: PRIVATE HEALTH INSURANCE | Source: Ambulatory Visit | Attending: Physician Assistant | Admitting: Physician Assistant

## 2021-05-06 DIAGNOSIS — Z01818 Encounter for other preprocedural examination: Secondary | ICD-10-CM

## 2022-03-10 ENCOUNTER — Ambulatory Visit
Admission: RE | Admit: 2022-03-10 | Discharge: 2022-03-10 | Disposition: A | Discharge status: Home / Self Care | Payer: PRIVATE HEALTH INSURANCE | Source: Ambulatory Visit | Attending: Family Medicine | Admitting: Family Medicine

## 2022-03-10 ENCOUNTER — Other Ambulatory Visit: Payer: Self-pay | Admitting: Family Medicine

## 2022-03-10 DIAGNOSIS — R079 Chest pain, unspecified: Secondary | ICD-10-CM

## 2023-03-19 ENCOUNTER — Encounter: Payer: Self-pay | Admitting: Gastroenterology

## 2023-04-16 ENCOUNTER — Ambulatory Visit (AMBULATORY_SURGERY_CENTER): Payer: Managed Care, Other (non HMO) | Admitting: *Deleted

## 2023-04-16 VITALS — Ht 61.0 in | Wt 151.0 lb

## 2023-04-16 DIAGNOSIS — Z860100 Personal history of colon polyps, unspecified: Secondary | ICD-10-CM

## 2023-04-16 MED ORDER — SUFLAVE 178.7 G PO SOLR: 1.0000 | Freq: Once | ORAL | 0 refills | Status: AC

## 2023-04-16 NOTE — Progress Notes (Signed)
Pre visit conducted over telephone Instructions through MyChart and mailed.   No egg or soy allergy known to patient  No issues known to pt with past sedation with any surgeries or procedures Patient denies ever being told they had issues or difficulty with intubation  No FH of Malignant Hyperthermia Pt is not on diet pills Pt is not on  home 02  Pt is not on blood thinners  Pt denies issues with constipation  No A fib or A flutter Have any cardiac testing pending--no Pt instructed to use [email or web address].com or GoodRx for a price reduction on prep

## 2023-05-12 ENCOUNTER — Encounter: Payer: Self-pay | Admitting: Gastroenterology

## 2023-05-12 ENCOUNTER — Ambulatory Visit: Payer: Managed Care, Other (non HMO) | Admitting: Gastroenterology

## 2023-05-12 VITALS — BP 135/87 | HR 87 | Temp 97.9°F | Resp 13 | Ht 61.5 in | Wt 151.0 lb

## 2023-05-12 DIAGNOSIS — K635 Polyp of colon: Secondary | ICD-10-CM

## 2023-05-12 DIAGNOSIS — D122 Benign neoplasm of ascending colon: Secondary | ICD-10-CM

## 2023-05-12 DIAGNOSIS — Z860100 Personal history of colon polyps, unspecified: Secondary | ICD-10-CM

## 2023-05-12 DIAGNOSIS — K644 Residual hemorrhoidal skin tags: Secondary | ICD-10-CM | POA: Diagnosis not present

## 2023-05-12 DIAGNOSIS — Z1211 Encounter for screening for malignant neoplasm of colon: Secondary | ICD-10-CM

## 2023-05-12 DIAGNOSIS — K648 Other hemorrhoids: Secondary | ICD-10-CM

## 2023-05-12 MED ORDER — SODIUM CHLORIDE 0.9 % IV SOLN: 500.0000 mL | Freq: Once | INTRAVENOUS | Status: DC

## 2023-05-12 NOTE — Op Note (Signed)
[facility] Patient Name: Rhonda Cruz Procedure Date: 05/12/2023 7:11 AM MRN: [id number] Endoscopist: Napoleon Form , MD, [id number] Age: 58 Referring MD:  Date of Birth: 28-May-1965 Gender: Female Account #: [id number] Procedure:                Colonoscopy Indications:              High risk colon cancer surveillance: Personal                            history of colonic polyps, High risk colon cancer                            surveillance: Personal history of adenoma (10 mm or                            greater in size) Medicines:                Monitored Anesthesia Care Procedure:                Pre-Anesthesia Assessment:                           - Prior to the procedure, a History and Physical                            was performed, and patient medications and                            allergies were reviewed. The patient's tolerance of                            previous anesthesia was also reviewed. The risks                            and benefits of the procedure and the sedation                            options and risks were discussed with the patient.                            All questions were answered, and informed consent                            was obtained. Prior Anticoagulants: The patient has                            taken no anticoagulant or antiplatelet agents. ASA                            Grade Assessment: II - A patient with mild systemic                            disease. After reviewing the risks and benefits,  the patient was deemed in satisfactory condition to                            undergo the procedure.                           After obtaining informed consent, the colonoscope                            was passed under direct vision. Throughout the                            procedure, the patient's blood pressure, pulse, and                            oxygen saturations were monitored  continuously. The                            [id number]-HQ190L Colonoscope [id number] was introduced                            through the anus and advanced to the the cecum,                            identified by appendiceal orifice and ileocecal                            valve. The colonoscopy was performed without                            difficulty. The patient tolerated the procedure                            well. The quality of the bowel preparation was                            good. The ileocecal valve, appendiceal orifice, and                            rectum were photographed. Scope In: 8:10:03 AM Scope Out: 8:25:01 AM Scope Withdrawal Time: 0 hours 9 minutes 20 seconds  Total Procedure Duration: 0 hours 14 minutes 58 seconds  Findings:                 The perianal and digital rectal examinations were                            normal.                           Two sessile polyps were found in the ascending                            colon. The polyps were 5 to 8 mm in size. These  polyps were removed with a cold snare. Resection                            and retrieval were complete.                           Non-bleeding external and internal hemorrhoids were                            found during retroflexion. The hemorrhoids were                            medium-sized. Complications:            No immediate complications. Estimated Blood Loss:     Estimated blood loss was minimal. Impression:               - Two 5 to 8 mm polyps in the ascending colon,                            removed with a cold snare. Resected and retrieved.                           - Non-bleeding external and internal hemorrhoids. Recommendation:           - Patient has a contact number available for                            emergencies. The signs and symptoms of potential                            delayed complications were discussed with the                             patient. Return to normal activities tomorrow.                            Written discharge instructions were provided to the                            patient.                           - Resume previous diet.                           - Continue present medications.                           - Await pathology results.                           - Repeat colonoscopy in 5 years for surveillance. Napoleon Form, MD 05/12/2023 8:32:52 AM This report has been signed electronically.

## 2023-05-12 NOTE — Progress Notes (Signed)
 Called to room to assist during endoscopic procedure.  Patient ID and intended procedure confirmed with present staff. Received instructions for my participation in the procedure from the performing physician.

## 2023-05-12 NOTE — Patient Instructions (Signed)
-  Handout on polyps provided -await pathology results -repeat colonoscopy in 5 years for surveillance recommended.  -Continue present medications .  YOU HAD AN ENDOSCOPIC PROCEDURE TODAY AT [facility]:   Refer to the procedure report that was given to you for any specific questions about what was found during the examination.  If the procedure report does not answer your questions, please call your gastroenterologist to clarify.  If you requested that your care partner not be given the details of your procedure findings, then the procedure report has been included in a sealed envelope for you to review at your convenience later.  YOU SHOULD EXPECT: Some feelings of bloating in the abdomen. Passage of more gas than usual.  Walking can help get rid of the air that was put into your GI tract during the procedure and reduce the bloating. If you had a lower endoscopy (such as a colonoscopy or flexible sigmoidoscopy) you may notice spotting of blood in your stool or on the toilet paper. If you underwent a bowel prep for your procedure, you may not have a normal bowel movement for a few days.  Please Note:  You might notice some irritation and congestion in your nose or some drainage.  This is from the oxygen used during your procedure.  There is no need for concern and it should clear up in a day or so.  SYMPTOMS TO REPORT IMMEDIATELY:  Following lower endoscopy (colonoscopy or flexible sigmoidoscopy):  Excessive amounts of blood in the stool  Significant tenderness or worsening of abdominal pains  Swelling of the abdomen that is new, acute  Fever of 100F or higher  For urgent or emergent issues, a gastroenterologist can be reached at any hour by calling [phone]. Do not use MyChart messaging for urgent concerns.    DIET:  We do recommend a small meal at first, but then you may proceed to your regular diet.  Drink plenty of fluids but you should avoid alcoholic beverages for  24 hours.  ACTIVITY:  You should plan to take it easy for the rest of today and you should NOT DRIVE or use heavy machinery until tomorrow (because of the sedation medicines used during the test).    FOLLOW UP: Our staff will call the number listed on your records the next business day following your procedure.  We will call around 7:15- 8:00 am to check on you and address any questions or concerns that you may have regarding the information given to you following your procedure. If we do not reach you, we will leave a message.     If any biopsies were taken you will be contacted by phone or by letter within the next 1-3 weeks.  Please call us at [phone] if you have not heard about the biopsies in 3 weeks.    SIGNATURES/CONFIDENTIALITY: You and/or your care partner have signed paperwork which will be entered into your electronic medical record.  These signatures attest to the fact that that the information above on your After Visit Summary has been reviewed and is understood.  Full responsibility of the confidentiality of this discharge information lies with you and/or your care-partner.

## 2023-05-12 NOTE — Progress Notes (Unsigned)
 Pt's states no medical or surgical changes since previsit or office visit.

## 2023-05-12 NOTE — Progress Notes (Unsigned)
[facility] History and Physical   Primary Care Physician:  Georgina Quint, MD   Reason for Procedure:  History of adenomatous colon polyps  Plan:    Surveillance colonoscopy with possible interventions as needed     HPI: Rhonda Cruz is a very pleasant 58 y.o. female here for surveillance colonoscopy. Denies any nausea, vomiting, abdominal pain, melena or bright red blood per rectum  The risks and benefits as well as alternatives of endoscopic procedure(s) have been discussed and reviewed. All questions answered. The patient agrees to proceed.    Past Medical History:  Diagnosis Date   Anemia    Asthma    Depression    Fatigue    Fibroids    HPV (human papilloma virus) infection    HSV infection     Past Surgical History:  Procedure Laterality Date   CERVIX LESION DESTRUCTION     COLONOSCOPY  1991   CYSTECTOMY     knee   INCISION / DRAINAGE HAND / FINGER  05/2017   TMJ ARTHROPLASTY Bilateral 1988   TONSILLECTOMY AND ADENOIDECTOMY     TOTAL HIP ARTHROPLASTY Right 2023    Prior to Admission medications   Medication Sig Start Date End Date Taking? Authorizing Provider  ADVAIR HFA 230-21 MCG/ACT inhaler 2 puffs Inhalation once a day for 90 days   Yes [provider]  b complex vitamins tablet Take 1 tablet by mouth daily.   Yes [provider]  fish oil-omega-3 fatty acids 1000 MG capsule Take 2 g by mouth daily.     Yes [provider]  FLUoxetine (PROZAC) 10 MG tablet Take 2 tablets (20 mg total) by mouth daily. 03/24/18  Yes Sagardia, Eilleen Kempf, MD  fluticasone-salmeterol (ADVAIR DISKUS) 250-50 MCG/ACT AEPB Advair Diskus 250 mcg-50 mcg/dose powder for inhalation   Yes [provider]  Fluticasone-Salmeterol (ADVAIR DISKUS) 250-50 MCG/DOSE AEPB INHALE ONE PUFF BY MOUTH EVERY 12 HOURS 10/14/18  Yes Sagardia, Eilleen Kempf, MD  albuterol (PROVENTIL HFA;VENTOLIN HFA) 108 (90 Base) MCG/ACT inhaler Inhale 2 puffs  into the lungs every 6 (six) hours as needed for wheezing. 03/03/18 12/18/20  Georgina Quint, MD  valACYclovir (VALTREX) 500 MG tablet Take 500 mg by mouth 2 (two) times daily.    [provider]    Current Outpatient Medications  Medication Sig Dispense Refill   ADVAIR HFA 230-21 MCG/ACT inhaler 2 puffs Inhalation once a day for 90 days     b complex vitamins tablet Take 1 tablet by mouth daily.     fish oil-omega-3 fatty acids 1000 MG capsule Take 2 g by mouth daily.       FLUoxetine (PROZAC) 10 MG tablet Take 2 tablets (20 mg total) by mouth daily. 60 tablet 5   fluticasone-salmeterol (ADVAIR DISKUS) 250-50 MCG/ACT AEPB Advair Diskus 250 mcg-50 mcg/dose powder for inhalation     Fluticasone-Salmeterol (ADVAIR DISKUS) 250-50 MCG/DOSE AEPB INHALE ONE PUFF BY MOUTH EVERY 12 HOURS 1 each 6   albuterol (PROVENTIL HFA;VENTOLIN HFA) 108 (90 Base) MCG/ACT inhaler Inhale 2 puffs into the lungs every 6 (six) hours as needed for wheezing. 1 Inhaler 5   valACYclovir (VALTREX) 500 MG tablet Take 500 mg by mouth 2 (two) times daily.     Current Facility-Administered Medications  Medication Dose Route Frequency Provider Last Rate Last Admin   0.9 %  sodium chloride infusion  500 mL Intravenous Once Napoleon Form, MD        Allergies as of 05/12/2023 -  Review Complete 05/12/2023  Allergen Reaction Noted   Lamotrigine Other (See Comments) 05/12/2023    Family History  Problem Relation Age of Onset   Cancer Mother 54       breast, s/p lumpectomy   Asthma Mother    Hypertension Father    Cancer Father        skin   Skin cancer Father    Cancer Brother        signs of skin cancer    Hypertension Brother    Depression Brother    Colon cancer Neg Hx    Esophageal cancer Neg Hx    Rectal cancer Neg Hx    Stomach cancer Neg Hx     Social History   Socioeconomic History   Marital status: Divorced    Spouse name: n/a   Number of children: 0   Years of education:  Master's +   Highest education level: Master's degree (e.g., MA, MS, MEng, MEd, MSW, MBA)  Occupational History   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 [employer]  Tobacco Use   Smoking status: Never   Smokeless tobacco: Never   Tobacco comments:    smoked some in teen years  Vaping Use   Vaping status: Never Used  Substance and Sexual Activity   Alcohol use: Yes    Alcohol/week: 3.0 - 1.0 standard drinks of alcohol    Types: 3 - 1 Glasses of wine per week    Comment: 1-3 drinks/ 2-3 days a week   Drug use: Yes    Comment: occas THC   Sexual activity: Not Currently    Partners: Male    Birth control/protection: Abstinence  Other Topics Concern   Not on file  Social History Narrative   3 Master's Degrees: MFA Interdisciplinary Arts, Med Theater Ed, Edm Merchandiser, retail and Psychology   Lives alone in a condo. She has someone who stays over frequently, but does not live with her.   She has a dog and cat.   Family lives locally    Social Drivers of Health   Financial Resource Strain: Not on file  Food Insecurity: Not on file  Transportation Needs: Not on file  Physical Activity: Not on file  Stress: Not on file  Social Connections: Not on file  Intimate Partner Violence: Not on file    Review of Systems:  All other review of systems negative except as mentioned in the HPI.  Physical Exam: Vital signs in last 24 hours: BP 125/77   Pulse 73   Temp 97.9 F (36.6 C) (Temporal)   Resp 11   Ht 5' 1.5" (1.562 m)   Wt 151 lb (68.5 kg)   LMP 11/14/2014   SpO2 98%   BMI 28.07 kg/m  General:   Alert, NAD Lungs:  Clear .   Heart:  Regular rate and rhythm Abdomen:  Soft, nontender and nondistended. Neuro/Psych:  Alert and cooperative. Normal mood and affect. A and O x 3  Reviewed labs, radiology imaging, old records and pertinent past GI work up  Patient is appropriate for planned procedure(s) and anesthesia in an ambulatory setting   K. Scherry Ran ,  MD [phone]

## 2023-05-13 ENCOUNTER — Telehealth: Payer: Self-pay

## 2023-05-13 NOTE — Telephone Encounter (Signed)
  Follow up Call-     05/12/2023    7:37 AM  Call back number  Post procedure Call Back phone  # [phone]  Permission to leave phone message Yes     Patient questions:  Do you have a fever, pain , or abdominal swelling? No. Pain Score  0 *  Have you tolerated food without any problems? Yes.    Have you been able to return to your normal activities? Yes.    Do you have any questions about your discharge instructions: Diet   No. Medications  No. Follow up visit  No.  Do you have questions or concerns about your Care? No.  Actions: * If pain score is 4 or above: No action needed, pain <4.

## 2023-05-14 LAB — SURGICAL PATHOLOGY

## 2023-05-24 IMAGING — MG DIGITAL DIAGNOSTIC BILAT W/ TOMO W/ CAD
6 of 10 series · 6 of 30 positions shown · non-contrast
Comparison: Previous exam(s).

CLINICAL DATA: 54-year-old female with diffuse bilateral OUTER
breast pain.

EXAM:
DIGITAL DIAGNOSTIC BILATERAL MAMMOGRAM WITH CAD AND TOMO

[R MLO synth-2D (1 of 2)]
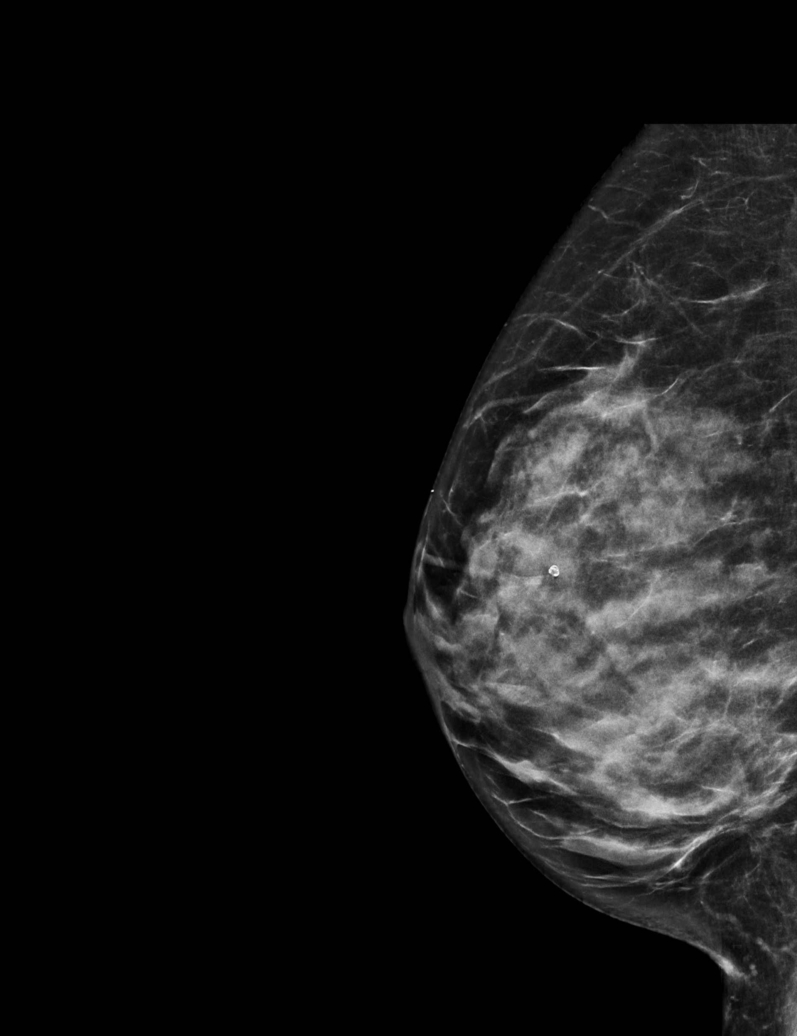

[R MLO synth-2D (2 of 2)]
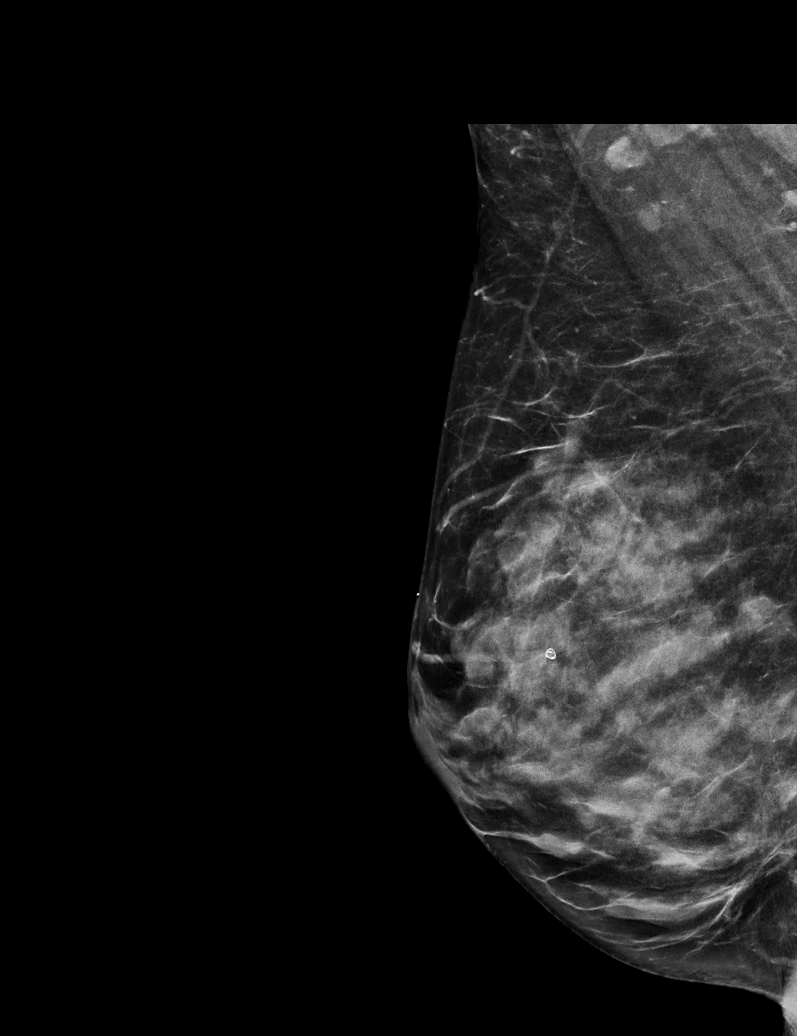

[L MLO synth-2D]
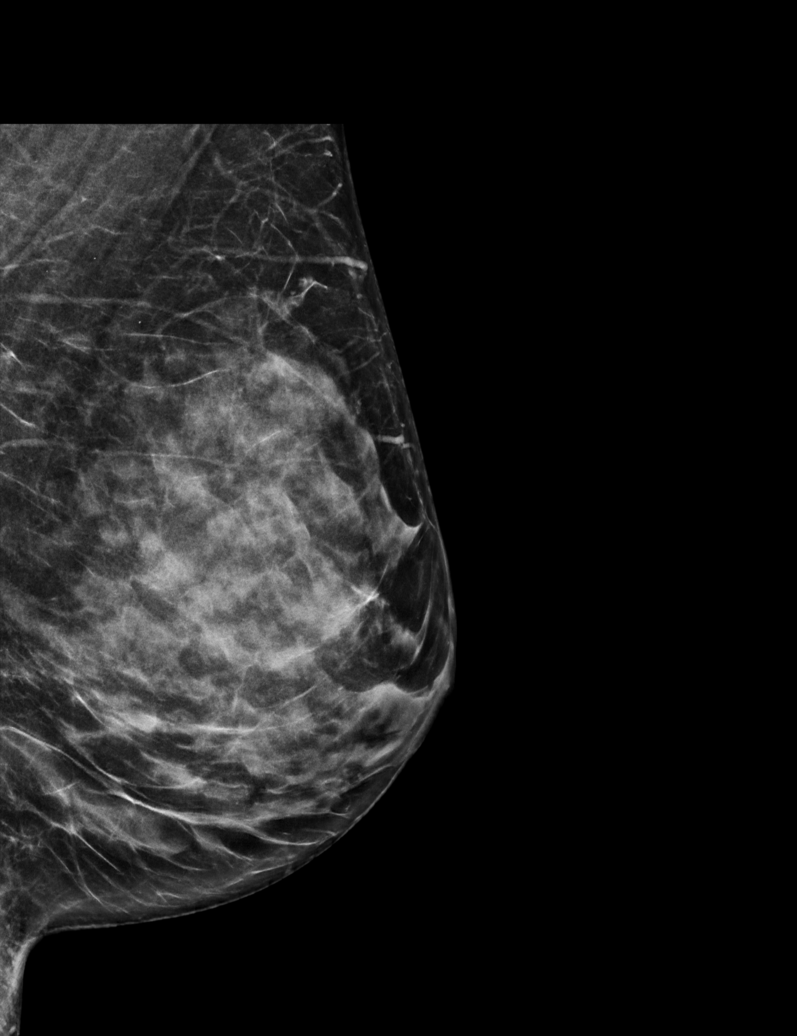

[L CC synth-2D]
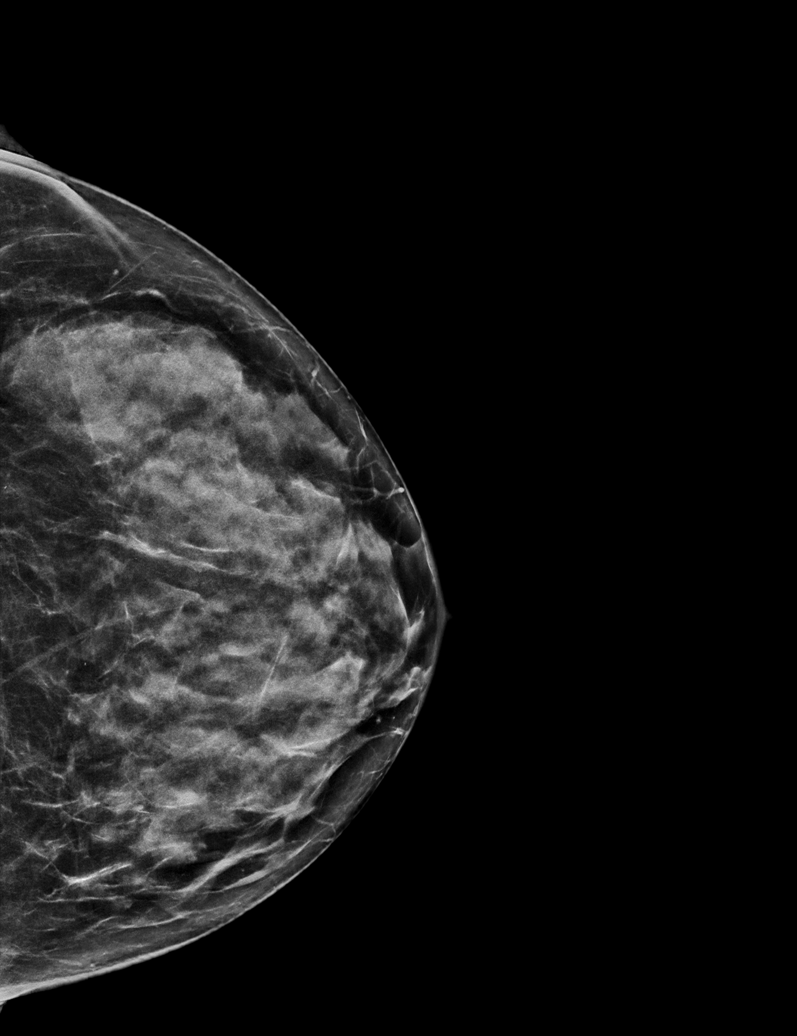

[R CC synth-2D]
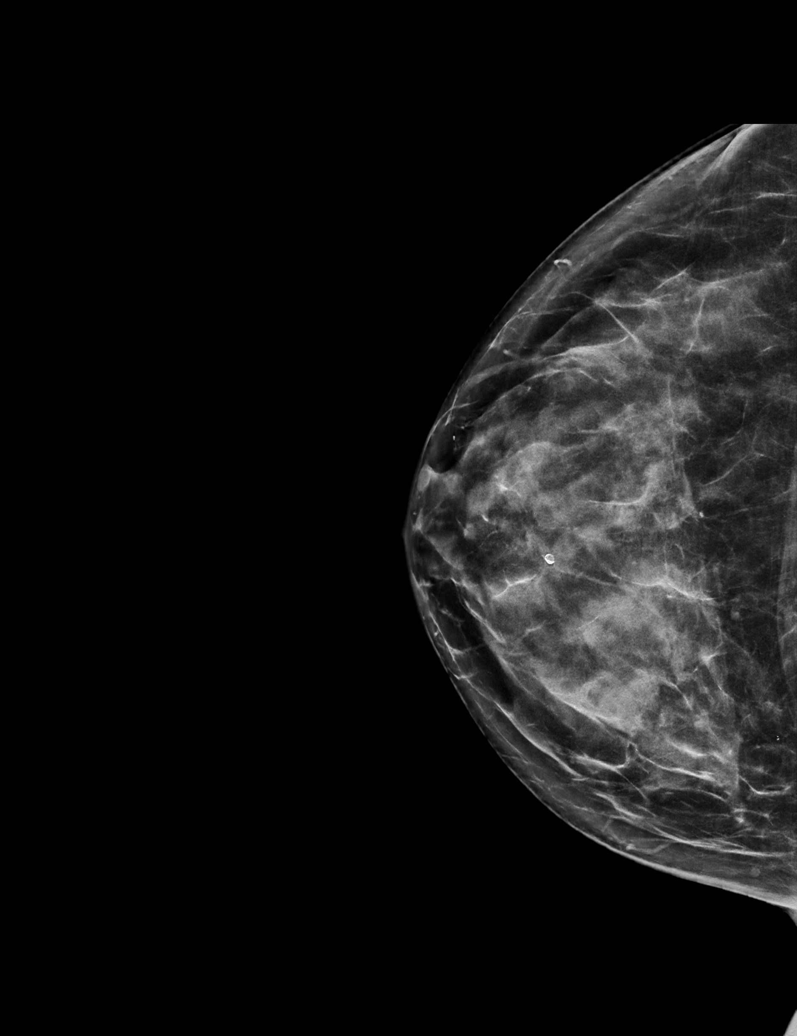

[R CC tomo · tomo slice 33/66.0]
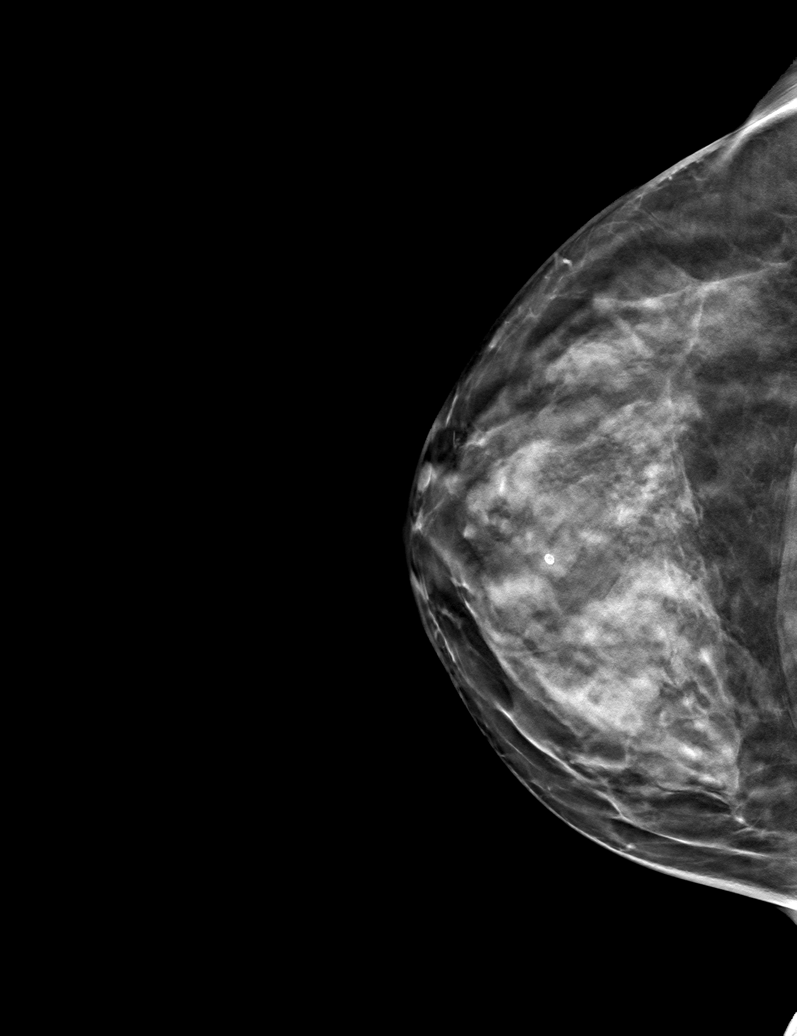

[6 of 30 positions shown; findings below may reference images not displayed]

ACR Breast Density Category d: The breast tissue is extremely dense,
which lowers the sensitivity of mammography.
FINDINGS: 2D/3D full field views of both breasts demonstrate no suspicious
mass, distortion or worrisome calcifications.

Mammographic images were processed with CAD.
IMPRESSION: No evidence of breast malignancy.

RECOMMENDATION:
Bilateral screening mammogram in 1 year.

I have discussed the findings, causes and remedies of breast pain
and recommendations with the patient. If applicable, a reminder
letter will be sent to the patient regarding the next appointment.

BI-RADS CATEGORY  1: Negative.

## 2023-06-28 ENCOUNTER — Encounter: Payer: Self-pay | Admitting: Gastroenterology

## 2023-07-30 ENCOUNTER — Other Ambulatory Visit (HOSPITAL_COMMUNITY): Payer: Self-pay | Admitting: Family Medicine

## 2023-07-30 DIAGNOSIS — E7800 Pure hypercholesterolemia, unspecified: Secondary | ICD-10-CM

## 2023-09-07 ENCOUNTER — Ambulatory Visit (HOSPITAL_COMMUNITY)
Admission: RE | Admit: 2023-09-07 | Discharge: 2023-09-07 | Disposition: A | Discharge status: Home / Self Care | Payer: Self-pay | Source: Ambulatory Visit | Attending: Family Medicine | Admitting: Family Medicine

## 2023-09-07 DIAGNOSIS — E7800 Pure hypercholesterolemia, unspecified: Secondary | ICD-10-CM | POA: Insufficient documentation

## 2023-12-30 ENCOUNTER — Other Ambulatory Visit: Payer: Self-pay | Admitting: Obstetrics and Gynecology

## 2023-12-30 DIAGNOSIS — Z1231 Encounter for screening mammogram for malignant neoplasm of breast: Secondary | ICD-10-CM

## 2024-01-18 ENCOUNTER — Encounter: Payer: Self-pay | Admitting: Family Medicine

## 2024-01-18 ENCOUNTER — Ambulatory Visit
Admission: RE | Admit: 2024-01-18 | Discharge: 2024-01-18 | Disposition: A | Discharge status: Home / Self Care | Source: Ambulatory Visit | Attending: Obstetrics and Gynecology | Admitting: Obstetrics and Gynecology

## 2024-01-18 DIAGNOSIS — Z1231 Encounter for screening mammogram for malignant neoplasm of breast: Secondary | ICD-10-CM

## 2024-02-06 IMAGING — CR DG CHEST 2V
2 series · 2 of 2 positions shown · non-contrast
Comparison: 05/11/2014

CLINICAL DATA: 55-year-old female with a history of preoperative
chest x-ray

EXAM:
CHEST - 2 VIEW

[w chest pa]
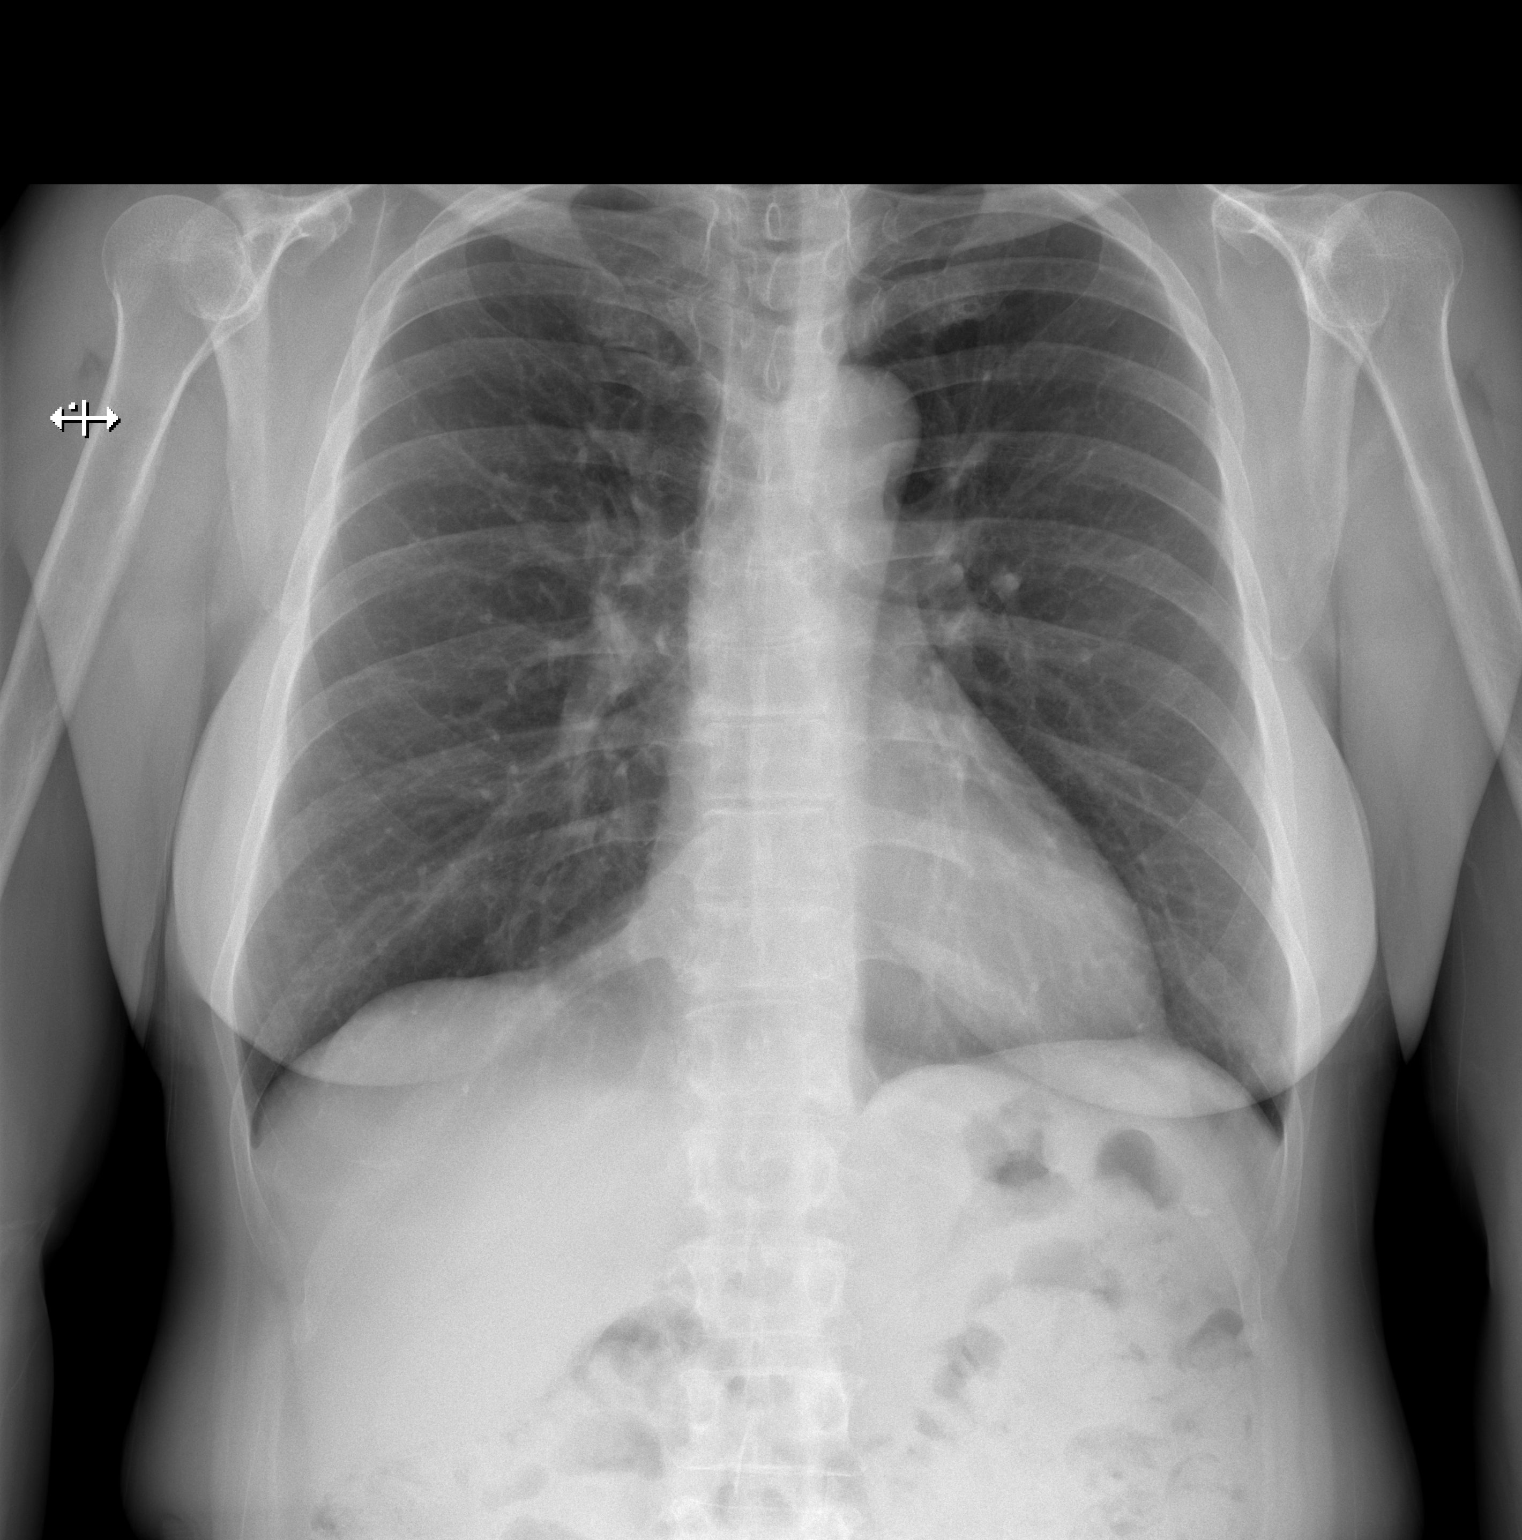

[w chest lat]
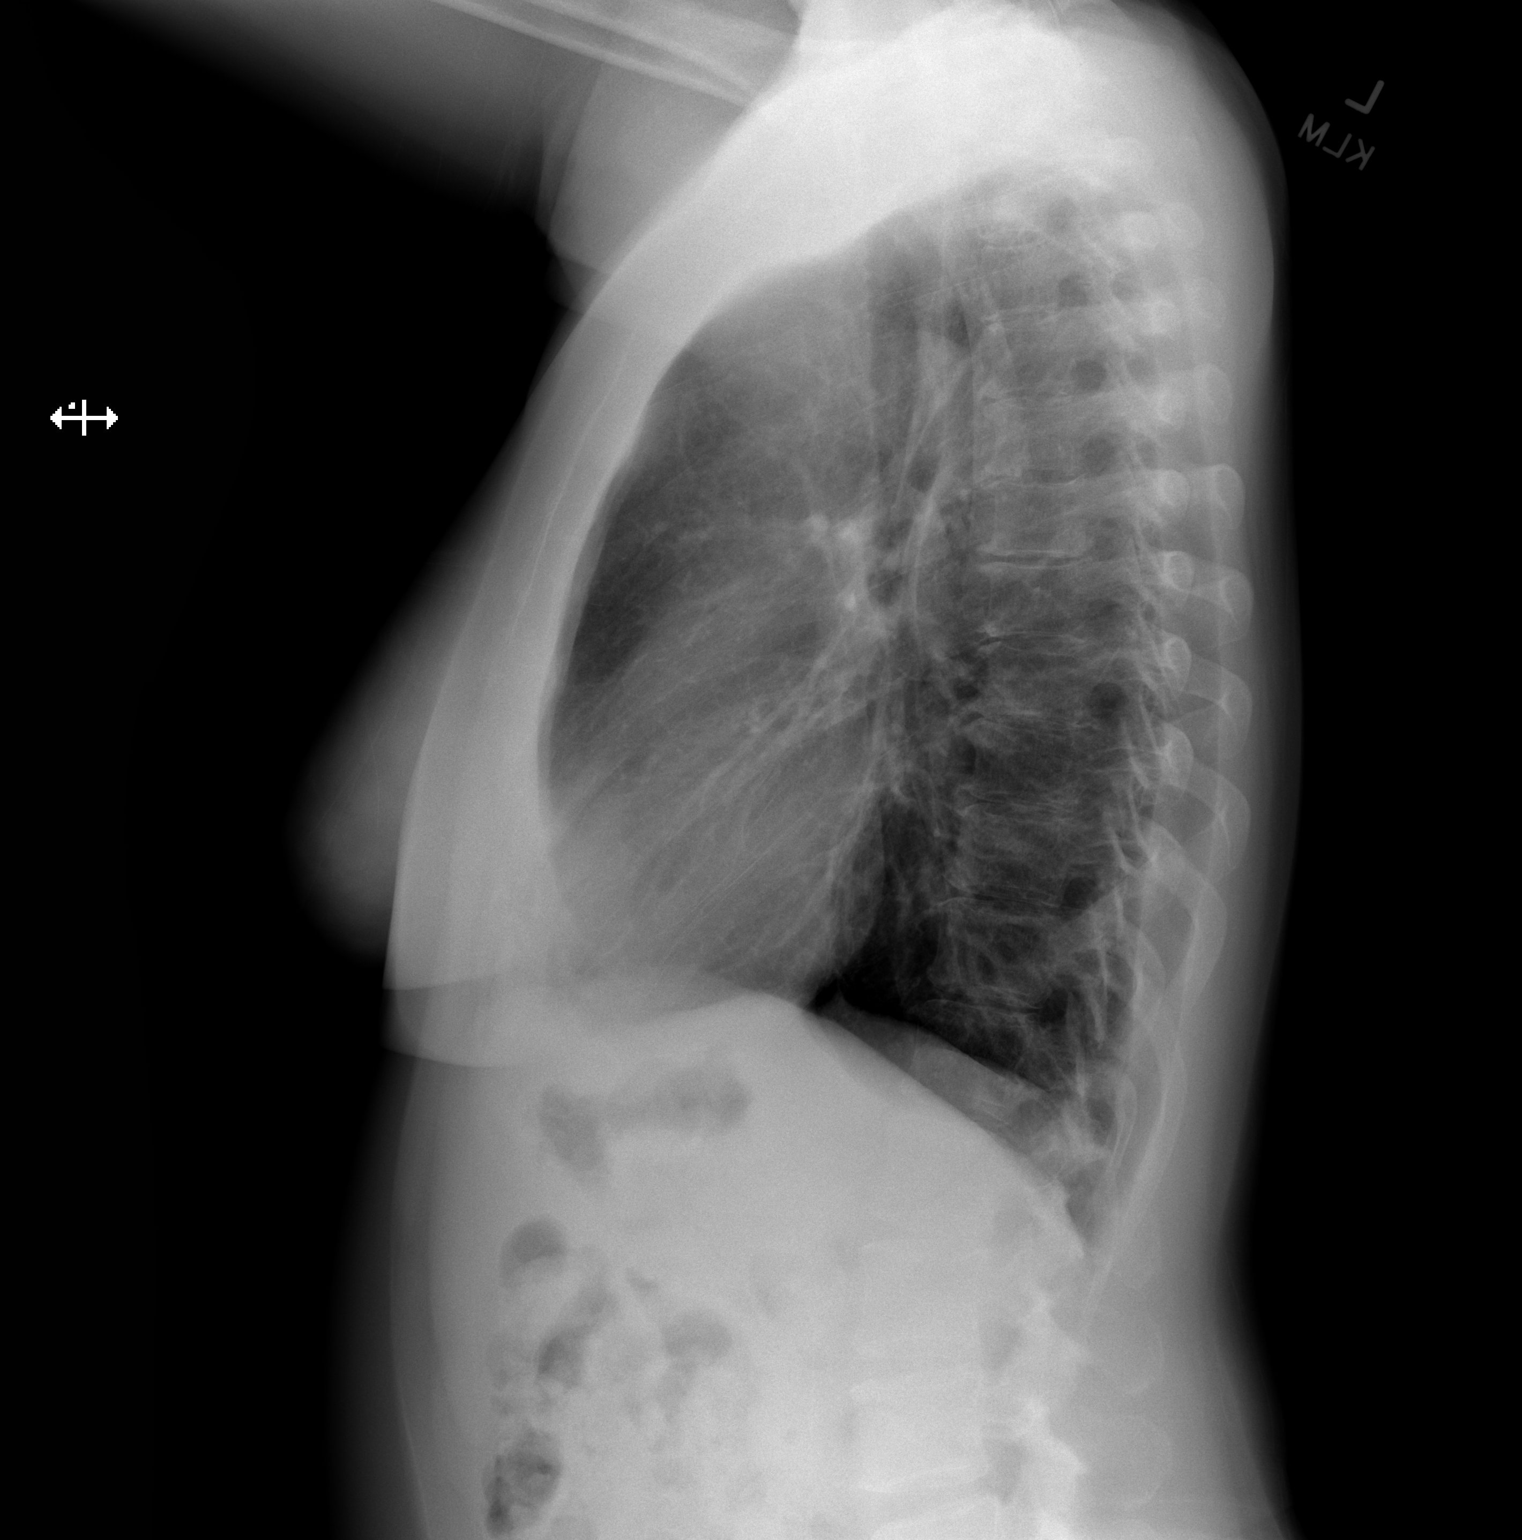

[2 of 2 positions shown; findings below may reference images not displayed]

FINDINGS: Cardiomediastinal silhouette unchanged in size and contour. No
evidence of central vascular congestion. No interlobular septal
thickening.

No pneumothorax or pleural effusion. Coarsened interstitial
markings, with no confluent airspace disease.

No acute displaced fracture. Degenerative changes of the spine.
IMPRESSION: No active cardiopulmonary disease.

## 2024-05-09 ENCOUNTER — Institutional Professional Consult (permissible substitution) (HOSPITAL_BASED_OUTPATIENT_CLINIC_OR_DEPARTMENT_OTHER): Payer: Self-pay | Admitting: Internal Medicine
